# Patient Record
Sex: Female | Born: 2000 | Race: Black or African American | Hispanic: No | Marital: Single | Smoking: Never smoker
Health system: Southern US, Community
[De-identification: ages and names within clinical notes are randomized; demographics above are authoritative.]

## PROBLEM LIST (undated history)

## (undated) DIAGNOSIS — F419 Anxiety disorder, unspecified: Secondary | ICD-10-CM

## (undated) DIAGNOSIS — Z789 Other specified health status: Secondary | ICD-10-CM

## (undated) HISTORY — PX: NO PAST SURGERIES: SHX2092

---
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
## 2020-01-27 ENCOUNTER — Emergency Department (HOSPITAL_COMMUNITY)
Admission: EM | Admit: 2020-01-27 | Discharge: 2020-01-27 | Disposition: A | Discharge status: Home / Self Care | Payer: Medicaid Other | Attending: Emergency Medicine | Admitting: Emergency Medicine

## 2020-01-27 DIAGNOSIS — Z79899 Other long term (current) drug therapy: Secondary | ICD-10-CM | POA: Diagnosis not present

## 2020-01-27 DIAGNOSIS — R112 Nausea with vomiting, unspecified: Secondary | ICD-10-CM | POA: Insufficient documentation

## 2020-01-27 DIAGNOSIS — Z3490 Encounter for supervision of normal pregnancy, unspecified, unspecified trimester: Secondary | ICD-10-CM | POA: Insufficient documentation

## 2020-01-27 LAB — COMPREHENSIVE METABOLIC PANEL
ALT: 13 U/L (ref 0–44)
AST: 14 U/L — ABNORMAL LOW (ref 15–41)
Albumin: 4.8 g/dL (ref 3.5–5.0)
Alkaline Phosphatase: 65 U/L (ref 38–126)
Anion gap: 15 (ref 5–15)
BUN: 20 mg/dL (ref 6–20)
CO2: 19 mmol/L — ABNORMAL LOW (ref 22–32)
Calcium: 9.9 mg/dL (ref 8.9–10.3)
Chloride: 100 mmol/L (ref 98–111)
Creatinine, Ser: 0.77 mg/dL (ref 0.44–1.00)
GFR calc Af Amer: >60 mL/min (ref >60)
GFR calc non Af Amer: >60 mL/min (ref >60)
Glucose, Bld: 102 mg/dL — ABNORMAL HIGH (ref 70–99)
Potassium: 3.9 mmol/L (ref 3.5–5.1)
Sodium: 134 mmol/L — ABNORMAL LOW (ref 135–145)
Total Bilirubin: 1.4 mg/dL — ABNORMAL HIGH (ref 0.3–1.2)
Total Protein: 8.9 g/dL — ABNORMAL HIGH (ref 6.5–8.1)

## 2020-01-27 LAB — URINALYSIS, ROUTINE W REFLEX MICROSCOPIC
Bacteria, UA: NONE SEEN
Bilirubin Urine: NEGATIVE
Glucose, UA: NEGATIVE mg/dL
Hgb urine dipstick: NEGATIVE
Ketones, ur: 80 mg/dL — AB
Nitrite: NEGATIVE
Protein, ur: 100 mg/dL — AB
Specific Gravity, Urine: 1.03 (ref 1.005–1.030)
pH: 6 (ref 5.0–8.0)

## 2020-01-27 LAB — CBC
HCT: 41.4 % (ref 36.0–46.0)
Hemoglobin: 13.8 g/dL (ref 12.0–15.0)
MCH: 28.2 pg (ref 26.0–34.0)
MCHC: 33.3 g/dL (ref 30.0–36.0)
MCV: 84.7 fL (ref 80.0–100.0)
Platelets: 305 10*3/uL (ref 150–400)
RBC: 4.89 MIL/uL (ref 3.87–5.11)
RDW: 12.5 % (ref 11.5–15.5)
WBC: 13.5 10*3/uL — ABNORMAL HIGH (ref 4.0–10.5)
nRBC: 0 % (ref 0.0–0.2)

## 2020-01-27 LAB — HCG, QUANTITATIVE, PREGNANCY: hCG, Beta Chain, Quant, S: 79765 m[IU]/mL — ABNORMAL HIGH (ref <5)

## 2020-01-27 LAB — I-STAT BETA HCG BLOOD, ED (MC, WL, AP ONLY): I-stat hCG, quantitative: >2000 m[IU]/mL — ABNORMAL HIGH (ref <5)

## 2020-01-27 LAB — LIPASE, BLOOD: Lipase: 19 U/L (ref 11–51)

## 2020-01-27 MED ORDER — ONDANSETRON HCL 4 MG/2ML IJ SOLN
4.0000 mg | Freq: Once | INTRAMUSCULAR | Status: AC
  Administered 2020-01-27: 16:00:00 4 mg via INTRAVENOUS
  Filled 2020-01-27: qty 2

## 2020-01-27 MED ORDER — SODIUM CHLORIDE 0.9% FLUSH: 3.0000 mL | Freq: Once | INTRAVENOUS | Status: DC

## 2020-01-27 MED ORDER — LACTATED RINGERS IV BOLUS
1000.0000 mL | Freq: Once | INTRAVENOUS | Status: AC
  Administered 2020-01-27: 1000 mL via INTRAVENOUS

## 2020-01-27 NOTE — ED Provider Notes (Signed)
[facility]-EMERGENCY DEPT Provider Note   CSN: [id number] Arrival date & time: 01/27/20  1241     History Chief Complaint  Patient presents with  . Nausea  . Emesis    Linda Bates is a 19 y.o. female with no known past medical history presenting to emergency department today via EMS with chief complaint of nausea and emesis x3 days.  She estimates over 15 episodes of nonbloody nonbilious emesis in the last 24 hours.  She is having difficulty tolerating p.o. intake secondary to nausea.  She is also endorsing generalized weakness secondary to the continuous emesis. Her LMP was in December, however admits she has irregular periods.  She has not taken any medications for symptoms prior to arrival. She denies any fever, chills, chest pain, shortness of breath, lack of sense of taste or smell, abdominal pain, pelvic pain, vaginal discharge, abnormal vaginal bleeding, diarrhea.  No past medical history on file.  There are no problems to display for this patient.     OB History   No obstetric history on file.     No family history on file.  Social History   Tobacco Use  . Smoking status: Not on file  Substance Use Topics  . Alcohol use: Not on file  . Drug use: Not on file    Home Medications Prior to Admission medications   Medication Sig Start Date End Date Taking? Authorizing Provider  ibuprofen (ADVIL) 200 MG tablet Take 200 mg by mouth every 6 (six) hours as needed for moderate pain.   Yes [provider]  OVER THE COUNTER MEDICATION Take 1 tablet by mouth daily. Black cohash   Yes [provider]    Allergies    Patient has no known allergies.  Review of Systems   Review of Systems  All other systems are reviewed and are negative for acute change except as noted in the HPI.   Physical Exam Updated Vital Signs BP 122/75   Pulse 88   Temp 98.4 F (36.9 C) (Oral)   Resp 15   LMP 12/08/2019   SpO2 99%    Physical Exam Vitals and nursing note reviewed.  Constitutional:      General: She is not in acute distress.    Appearance: She is not ill-appearing.  HENT:     Head: Normocephalic and atraumatic.     Right Ear: Tympanic membrane and external ear normal.     Left Ear: Tympanic membrane and external ear normal.     Nose: Nose normal.     Mouth/Throat:     Mouth: Mucous membranes are dry.     Pharynx: Oropharynx is clear.  Eyes:     General: No scleral icterus.       Right eye: No discharge.        Left eye: No discharge.     Extraocular Movements: Extraocular movements intact.     Conjunctiva/sclera: Conjunctivae normal.     Pupils: Pupils are equal, round, and reactive to light.  Neck:     Vascular: No JVD.  Cardiovascular:     Rate and Rhythm: Normal rate and regular rhythm.     Pulses: Normal pulses.          Radial pulses are 2+ on the right side and 2+ on the left side.     Heart sounds: Normal heart sounds.  Pulmonary:     Comments: Lungs clear to auscultation in all fields. Symmetric chest rise. No wheezing, rales,  or rhonchi. Abdominal:     General: Bowel sounds are normal.     Tenderness: There is no right CVA tenderness or left CVA tenderness.     Comments: Abdomen is soft, non-distended, and non-tender in all quadrants. No rigidity, no guarding. No peritoneal signs.  Musculoskeletal:        General: Normal range of motion.     Cervical back: Normal range of motion.  Skin:    General: Skin is warm and dry.     Capillary Refill: Capillary refill takes less than 2 seconds.     Findings: No rash.  Neurological:     Mental Status: She is oriented to person, place, and time.     GCS: GCS eye subscore is 4. GCS verbal subscore is 5. GCS motor subscore is 6.     Comments: Fluent speech, no facial droop.  Psychiatric:        Behavior: Behavior normal.       ED Results / Procedures / Treatments   Labs (all labs ordered are listed, but only abnormal results are  displayed) Labs Reviewed  COMPREHENSIVE METABOLIC PANEL - Abnormal; Notable for the following components:      Result Value   Sodium 134 (*)    CO2 19 (*)    Glucose, Bld 102 (*)    Total Protein 8.9 (*)    AST 14 (*)    Total Bilirubin 1.4 (*)    All other components within normal limits  CBC - Abnormal; Notable for the following components:   WBC 13.5 (*)    All other components within normal limits  URINALYSIS, ROUTINE W REFLEX MICROSCOPIC - Abnormal; Notable for the following components:   Ketones, ur 80 (*)    Protein, ur 100 (*)    Leukocytes,Ua SMALL (*)    All other components within normal limits  HCG, QUANTITATIVE, PREGNANCY - Abnormal; Notable for the following components:   hCG, Beta Chain, Quant, S [id number] (*)    All other components within normal limits  I-STAT BETA HCG BLOOD, ED ([facility], [facility], [facility] ONLY) - Abnormal; Notable for the following components:   I-stat hCG, quantitative >2,000.0 (*)    All other components within normal limits  LIPASE, BLOOD    EKG None  Radiology No results found.  Procedures Procedures (including critical care time)  Medications Ordered in ED Medications  lactated ringers bolus 1,000 mL (0 mLs Intravenous Stopped 01/27/20 1702)  ondansetron (ZOFRAN) injection 4 mg (4 mg Intravenous Given 01/27/20 1555)  lactated ringers bolus 1,000 mL (0 mLs Intravenous Stopped 01/27/20 1819)    ED Course  I have reviewed the triage vital signs and the nursing notes.  Pertinent labs & imaging results that were available during my care of the patient were reviewed by me and considered in my medical decision making (see chart for details).    MDM Rules/Calculators/A&P                      Patient seen and examined. Patient presents awake, alert, hemodynamically stable, afebrile, non toxic. She was noted to be tachycardic to 106 in triage. During exam heart rate ranging from 84-97, no tachycardia. She looks dehydrated, has dry mucus membranes but  normal cap refill. On exam she has no abdominal tenderness.   I reviewed labs ordered in triage and it appears patient is pregnant, I-stat beta quant >2000.  CBC with leukocytosis of 13.5, no anemia.  I suspect her leukocytosis is related  to the assistant vomiting patient has had. Lipase is within normal range.  Her CMP is significant for bicarb of 19, total bili of 1.4, high end of normal anion gap, normal potassium. Will give 1L LR. Engaged in shared decision making for zofran given she is pregnant, discussed risks and benefits, patient would like zofran to help her nausea given how severe it has been.  Her vitals are stable, no tachcyardia and BP is normotensive. Given these reassuring findings and no abdominal pain on  exam feel that ectopic pregnancy is unlikely.  UA shows 80 ketones, small leukocytes, no WBC, nitrite negative. Second liter of LR given for dehydration. Quant obtained if needed for OB follow up, is 79,765.  On reassessment patient reports feeling much better. She is tolerating PO intake and has drank multiple cups of water while here in ED. She is eager to be discharged home. Patient given information for on call OB for follow up. She declines needs for prescription for prenatal vitamins, she plans to buy them OTC.  The patient appears reasonably screened and/or stabilized for discharge and I doubt any other medical condition or other [facility] requiring further screening, evaluation, or treatment in the ED at this time prior to discharge. The patient is safe for discharge with strict return precautions discussed. Recommend OB follow up for further workup of pregnancy. Findings and plan of care discussed with supervising physician Dr. Vanita Panda.   Portions of this note were generated with [employer]. Dictation errors may occur despite best attempts at proofreading.   Final Clinical Impression(s) / ED Diagnoses Final diagnoses:  Pregnancy, unspecified gestational age   Non-intractable vomiting with nausea, unspecified vomiting type    Rx / DC Orders ED Discharge Orders    None       Flint Melter 01/28/20 0020    Carmin Muskrat, MD 01/28/20 1948

## 2020-01-27 NOTE — Discharge Instructions (Addendum)
You have been seen today for nausea and vomiting. Please read and follow all provided instructions. Return to the emergency room for worsening condition or new concerning symptoms including abdominal pain, abdominal cramping, vaginal bleeding, worsening nausea and vomiting.  Your beta quant today was 79,765.  That does not come exactly how far along you are but I guess you could be around 5 weeks however I am not positive.  You will need to see the OB doctors to confirm how far along you are.  1. Medications:  Recommend you start taking prenatal vitamins as we discussed.  He can buy these over-the-counter. Do not take ibuprofen, Aleve, Motrin, Advil as these are all anti-inflammatory medications are not recommended in pregnancy. Take medications as prescribed. Please review all of the medicines and only take them if you do not have an allergy to them.   2. Treatment: rest, drink plenty of fluids  3. Follow Up:  Please follow up with an OB doctor. I have given you the information for the women's clinic. Please call their office to schedule a follow up appointment and to establish prenatal care.   It is also a possibility that you have an allergic reaction to any of the medicines that you have been prescribed - Everybody reacts differently to medications and while MOST people have no trouble with most medicines, you may have a reaction such as nausea, vomiting, rash, swelling, shortness of breath. If this is the case, please stop taking the medicine immediately and contact your physician.  ?

## 2020-01-27 NOTE — ED Triage Notes (Signed)
N/V x 3 days, BIB EMS

## 2020-01-30 ENCOUNTER — Emergency Department (HOSPITAL_COMMUNITY)
Admission: EM | Admit: 2020-01-30 | Discharge: 2020-01-30 | Disposition: A | Discharge status: Home / Self Care | Payer: Medicaid Other | Attending: Emergency Medicine | Admitting: Emergency Medicine

## 2020-01-30 ENCOUNTER — Other Ambulatory Visit: Payer: Self-pay

## 2020-01-30 ENCOUNTER — Encounter (HOSPITAL_COMMUNITY): Payer: Self-pay

## 2020-01-30 DIAGNOSIS — O219 Vomiting of pregnancy, unspecified: Secondary | ICD-10-CM | POA: Diagnosis present

## 2020-01-30 DIAGNOSIS — Z3A00 Weeks of gestation of pregnancy not specified: Secondary | ICD-10-CM | POA: Insufficient documentation

## 2020-01-30 DIAGNOSIS — O210 Mild hyperemesis gravidarum: Secondary | ICD-10-CM | POA: Diagnosis not present

## 2020-01-30 LAB — COMPREHENSIVE METABOLIC PANEL WITH GFR
ALT: 28 U/L (ref 0–44)
AST: 21 U/L (ref 15–41)
Albumin: 4.8 g/dL (ref 3.5–5.0)
Alkaline Phosphatase: 68 U/L (ref 38–126)
Anion gap: 16 — ABNORMAL HIGH (ref 5–15)
BUN: 17 mg/dL (ref 6–20)
CO2: 21 mmol/L — ABNORMAL LOW (ref 22–32)
Calcium: 9.9 mg/dL (ref 8.9–10.3)
Chloride: 98 mmol/L (ref 98–111)
Creatinine, Ser: 0.71 mg/dL (ref 0.44–1.00)
GFR calc Af Amer: >60 mL/min
GFR calc non Af Amer: >60 mL/min
Glucose, Bld: 91 mg/dL (ref 70–99)
Potassium: 3.1 mmol/L — ABNORMAL LOW (ref 3.5–5.1)
Sodium: 135 mmol/L (ref 135–145)
Total Bilirubin: 1.2 mg/dL (ref 0.3–1.2)
Total Protein: 9 g/dL — ABNORMAL HIGH (ref 6.5–8.1)

## 2020-01-30 LAB — LIPASE, BLOOD: Lipase: 22 U/L (ref 11–51)

## 2020-01-30 LAB — CBC
HCT: 39.6 % (ref 36.0–46.0)
Hemoglobin: 13.7 g/dL (ref 12.0–15.0)
MCH: 28.4 pg (ref 26.0–34.0)
MCHC: 34.6 g/dL (ref 30.0–36.0)
MCV: 82.2 fL (ref 80.0–100.0)
Platelets: 327 K/uL (ref 150–400)
RBC: 4.82 MIL/uL (ref 3.87–5.11)
RDW: 12.4 % (ref 11.5–15.5)
WBC: 9.2 K/uL (ref 4.0–10.5)
nRBC: 0 % (ref 0.0–0.2)

## 2020-01-30 LAB — I-STAT BETA HCG BLOOD, ED (MC, WL, AP ONLY): I-stat hCG, quantitative: >2000 m[IU]/mL — ABNORMAL HIGH (ref <5)

## 2020-01-30 LAB — HCG, QUANTITATIVE, PREGNANCY: hCG, Beta Chain, Quant, S: 122899 m[IU]/mL — ABNORMAL HIGH (ref <5)

## 2020-01-30 MED ORDER — ONDANSETRON 4 MG PO TBDP: 4.0000 mg | ORAL_TABLET | Freq: Three times a day (TID) | ORAL | 0 refills | Status: DC | PRN

## 2020-01-30 MED ORDER — METOCLOPRAMIDE HCL 10 MG PO TABS
10.0000 mg | ORAL_TABLET | Freq: Once | ORAL | Status: AC
  Administered 2020-01-30: 10 mg via ORAL
  Filled 2020-01-30: qty 1

## 2020-01-30 MED ORDER — SODIUM CHLORIDE 0.9 % IV BOLUS
1000.0000 mL | Freq: Once | INTRAVENOUS | Status: AC
  Administered 2020-01-30: 1000 mL via INTRAVENOUS

## 2020-01-30 MED ORDER — SODIUM CHLORIDE 0.9% FLUSH: 3.0000 mL | Freq: Once | INTRAVENOUS | Status: DC

## 2020-01-30 MED ORDER — ONDANSETRON HCL 4 MG/2ML IJ SOLN
4.0000 mg | Freq: Once | INTRAMUSCULAR | Status: AC
  Administered 2020-01-30: 17:00:00 4 mg via INTRAVENOUS
  Filled 2020-01-30: qty 2

## 2020-01-30 NOTE — Discharge Instructions (Signed)
Make sure you are staying well-hydrated water. Use Zofran as needed for nausea or vomiting.  This is a pill that will dissolve under your tongue, you so you do not need to swallow if you are feeling nauseous. Follow-up with the women's clinic after scheduled appointment next week. Return to the emergency room if you develop fevers, persistent vomiting, severe worsening abdominal pain, vaginal bleeding, any new, worsening, or concerning symptoms.

## 2020-01-30 NOTE — ED Provider Notes (Signed)
[facility]-EMERGENCY DEPT Provider Note   CSN: [id number] Arrival date & time: 01/30/20  1437     History Chief Complaint  Patient presents with  . Emesis During Pregnancy  . Abdominal Pain    Linda Bates is a 19 y.o. female presenting for evaluation of nausea and vomiting.   Patient states for the past week, she is having persistent nausea and vomiting.  She states she is pregnant, does not know how far along she is.  Her last period was in December, 2 months ago.  She states she was seen in the ED, felt better after fluids and medicine, but did not get any medicine for home.  She has not followed up with OB/GYN, has an appointment next week.  She denies fevers, chills, chest pain, cough, abdominal pain, urinary symptoms, vaginal bleeding, or abnormal bowel movements.  She has no other medical problems, takes medications daily.  She has not taken anything for her symptoms.  Patient states anytime she tries to eat or drink, this prompts vomiting.  HPI     History reviewed. No pertinent past medical history.  Patient Active Problem List   Diagnosis Date Noted  . Tension headache 11/20/2016  . Migraine without aura and without status migrainosus, not intractable 11/20/2016  . Anxiety state 11/20/2016    History reviewed. No pertinent surgical history.   OB History    Gravida  1   Para      Term      Preterm      AB      Living        SAB      TAB      Ectopic      Multiple      Live Births              Family History  Problem Relation Age of Onset  . Migraines Mother   . Heart attack Paternal Grandfather     Social History   Tobacco Use  . Smoking status: Never Smoker  . Smokeless tobacco: Never Used  Substance Use Topics  . Alcohol use: No  . Drug use: No    Home Medications Prior to Admission medications   Medication Sig Start Date End Date Taking? Authorizing Provider  amitriptyline (ELAVIL) 25 MG tablet  Take 1 tablet (25 mg total) by mouth at bedtime. Patient not taking: Reported on 08/24/2017 11/20/16   Keturah Shavers, MD  ibuprofen (ADVIL) 200 MG tablet Take 200 mg by mouth every 6 (six) hours as needed for moderate pain.    [provider]  ondansetron (ZOFRAN ODT) 4 MG disintegrating tablet Take 1 tablet (4 mg total) by mouth every 8 (eight) hours as needed for nausea or vomiting. 01/30/20   Remigio Mcmillon, PA-C  OVER THE COUNTER MEDICATION Take 1 tablet by mouth daily. Black cohash    [provider]    Allergies    Patient has no known allergies.  Review of Systems   Review of Systems  Gastrointestinal: Positive for nausea and vomiting.    Physical Exam Updated Vital Signs BP 116/60 (BP Location: Left Arm)   Pulse 68   Temp 98.2 F (36.8 C) (Oral)   Resp 14   LMP 12/08/2019   SpO2 99%   Physical Exam Vitals and nursing note reviewed.  Constitutional:      General: She is not in acute distress.    Appearance: She is well-developed.     Comments: Resting  in the bed in no acute distress  HENT:     Head: Normocephalic and atraumatic.  Eyes:     Conjunctiva/sclera: Conjunctivae normal.     Pupils: Pupils are equal, round, and reactive to light.  Cardiovascular:     Rate and Rhythm: Normal rate and regular rhythm.     Pulses: Normal pulses.  Pulmonary:     Effort: Pulmonary effort is normal. No respiratory distress.     Breath sounds: Normal breath sounds. No wheezing.  Abdominal:     General: There is no distension.     Palpations: Abdomen is soft. There is no mass.     Tenderness: There is no abdominal tenderness. There is no guarding or rebound.     Comments: No tenderness palpation the abdomen.  Soft without rigidity, guarding, distention.  Negative rebound.  Musculoskeletal:        General: Normal range of motion.     Cervical back: Normal range of motion and neck supple.  Skin:    General: Skin is warm and dry.     Capillary Refill:  Capillary refill takes less than 2 seconds.  Neurological:     Mental Status: She is alert and oriented to person, place, and time.     ED Results / Procedures / Treatments   Labs (all labs ordered are listed, but only abnormal results are displayed) Labs Reviewed  COMPREHENSIVE METABOLIC PANEL - Abnormal; Notable for the following components:      Result Value   Potassium 3.1 (*)    CO2 21 (*)    Total Protein 9.0 (*)    Anion gap 16 (*)    All other components within normal limits  HCG, QUANTITATIVE, PREGNANCY - Abnormal; Notable for the following components:   hCG, Beta Chain, Quant, S 122,899 (*)    All other components within normal limits  I-STAT BETA HCG BLOOD, ED ([facility], [facility], [facility] ONLY) - Abnormal; Notable for the following components:   I-stat hCG, quantitative >2,000.0 (*)    All other components within normal limits  LIPASE, BLOOD  CBC  URINALYSIS, ROUTINE W REFLEX MICROSCOPIC    EKG None  Radiology No results found.  Procedures Procedures (including critical care time)  Medications Ordered in ED Medications  sodium chloride flush (NS) 0.9 % injection 3 mL (0 mLs Intravenous Hold 01/30/20 1538)  sodium chloride 0.9 % bolus 1,000 mL (0 mLs Intravenous Stopped 01/30/20 1939)  ondansetron (ZOFRAN) injection 4 mg (4 mg Intravenous Given 01/30/20 1700)  metoCLOPramide (REGLAN) tablet 10 mg (10 mg Oral Given 01/30/20 1857)    ED Course  I have reviewed the triage vital signs and the nursing notes.  Pertinent labs & imaging results that were available during my care of the patient were reviewed by me and considered in my medical decision making (see chart for details).    MDM Rules/Calculators/A&P                      Patient presenting for evaluation of nausea and vomiting in the setting of pregnancy.  Physical exam shows patient appears nontoxic.  Abdominal exam is reassuring, no tenderness.  Likely hyperemesis gravidarum.  Will obtain labs to assess electrolyte  status and creatinine.  Will treat with fluids and Zofran and reassess.  Labs interpreted by me, overall reassuring.  Mild hypokalemia, likely 2/2 vomting and will improve with regular PO intake. .  Creatinine stable.  Gap of 16, likely due to vomiting.  HCG 122,000,  likely first trimester but unknown number of weeks.  On reassessment, patient reports she is feeling better.  P.o. challenge with apple juice.  On reassessment, patient reports no further vomiting.  She is still slightly nauseous.  Will give Reglan p.o., but as patient is no longer vomiting, can go home.  Discussed importance of follow-up with OB/GYN as scheduled.  Discussed prompt return with worsening symptoms.  At this time, patient appears safe for discharge.  Return precautions given.  Patient states she understands and agrees to plan.  Final Clinical Impression(s) / ED Diagnoses Final diagnoses:  Hyperemesis gravidarum    Rx / DC Orders ED Discharge Orders         Ordered    ondansetron (ZOFRAN ODT) 4 MG disintegrating tablet  Every 8 hours PRN     01/30/20 1922           Franchot Heidelberg, PA-C 01/30/20 2007    Dorie Rank, MD 02/02/20 1040

## 2020-01-30 NOTE — ED Triage Notes (Signed)
Pt presents with c/o vomiting during pregnancy. Pt reports her last period was sometime in December and she does have a confirmed pregnancy, unsure of how far along she is.

## 2020-01-30 NOTE — ED Notes (Signed)
Rayfield Citizen, RN and [place], RN attempted IV insertion. Kathlene November, RN will attempt IV ultrasound.

## 2020-02-01 ENCOUNTER — Inpatient Hospital Stay (HOSPITAL_COMMUNITY)
Admission: EM | Admit: 2020-02-01 | Discharge: 2020-02-01 | Disposition: A | Discharge status: Home / Self Care | Payer: Medicaid Other | Attending: Obstetrics & Gynecology | Admitting: Obstetrics & Gynecology

## 2020-02-01 ENCOUNTER — Other Ambulatory Visit: Payer: Self-pay

## 2020-02-01 ENCOUNTER — Encounter (HOSPITAL_COMMUNITY): Payer: Self-pay | Admitting: Emergency Medicine

## 2020-02-01 ENCOUNTER — Inpatient Hospital Stay (HOSPITAL_COMMUNITY): Payer: Medicaid Other

## 2020-02-01 DIAGNOSIS — Z3A01 Less than 8 weeks gestation of pregnancy: Secondary | ICD-10-CM | POA: Diagnosis not present

## 2020-02-01 DIAGNOSIS — O219 Vomiting of pregnancy, unspecified: Secondary | ICD-10-CM | POA: Diagnosis present

## 2020-02-01 DIAGNOSIS — O26899 Other specified pregnancy related conditions, unspecified trimester: Secondary | ICD-10-CM

## 2020-02-01 DIAGNOSIS — R109 Unspecified abdominal pain: Secondary | ICD-10-CM

## 2020-02-01 HISTORY — DX: Other specified health status: Z78.9

## 2020-02-01 LAB — URINALYSIS, ROUTINE W REFLEX MICROSCOPIC
Bilirubin Urine: NEGATIVE
Glucose, UA: NEGATIVE mg/dL
Hgb urine dipstick: NEGATIVE
Ketones, ur: 80 mg/dL — AB
Nitrite: NEGATIVE
Protein, ur: 30 mg/dL — AB
Specific Gravity, Urine: 1.027 (ref 1.005–1.030)
pH: 6 (ref 5.0–8.0)

## 2020-02-01 LAB — COMPREHENSIVE METABOLIC PANEL
ALT: 39 U/L (ref 0–44)
AST: 39 U/L (ref 15–41)
Albumin: 4.1 g/dL (ref 3.5–5.0)
Alkaline Phosphatase: 64 U/L (ref 38–126)
Anion gap: 17 — ABNORMAL HIGH (ref 5–15)
BUN: 14 mg/dL (ref 6–20)
CO2: 20 mmol/L — ABNORMAL LOW (ref 22–32)
Calcium: 9.6 mg/dL (ref 8.9–10.3)
Chloride: 98 mmol/L (ref 98–111)
Creatinine, Ser: 0.69 mg/dL (ref 0.44–1.00)
GFR calc Af Amer: >60 mL/min (ref >60)
GFR calc non Af Amer: >60 mL/min (ref >60)
Glucose, Bld: 84 mg/dL (ref 70–99)
Potassium: 3.5 mmol/L (ref 3.5–5.1)
Sodium: 135 mmol/L (ref 135–145)
Total Bilirubin: 2.2 mg/dL — ABNORMAL HIGH (ref 0.3–1.2)
Total Protein: 7.3 g/dL (ref 6.5–8.1)

## 2020-02-01 LAB — CBC
HCT: 38.4 % (ref 36.0–46.0)
Hemoglobin: 13.2 g/dL (ref 12.0–15.0)
MCH: 28.3 pg (ref 26.0–34.0)
MCHC: 34.4 g/dL (ref 30.0–36.0)
MCV: 82.2 fL (ref 80.0–100.0)
Platelets: 344 10*3/uL (ref 150–400)
RBC: 4.67 MIL/uL (ref 3.87–5.11)
RDW: 12.5 % (ref 11.5–15.5)
WBC: 9.8 10*3/uL (ref 4.0–10.5)
nRBC: 0 % (ref 0.0–0.2)

## 2020-02-01 LAB — WET PREP, GENITAL
Clue Cells Wet Prep HPF POC: NONE SEEN
Sperm: NONE SEEN
Trich, Wet Prep: NONE SEEN

## 2020-02-01 MED ORDER — SCOPOLAMINE 1 MG/3DAYS TD PT72
1.0000 | MEDICATED_PATCH | TRANSDERMAL | Status: DC
  Administered 2020-02-01: 20:00:00 1.5 mg via TRANSDERMAL
  Filled 2020-02-01: qty 1

## 2020-02-01 MED ORDER — PROMETHAZINE HCL 25 MG PO TABS: 25.0000 mg | ORAL_TABLET | Freq: Four times a day (QID) | ORAL | 0 refills | Status: DC | PRN

## 2020-02-01 MED ORDER — PROMETHAZINE HCL 25 MG/ML IJ SOLN
25.0000 mg | Freq: Four times a day (QID) | INTRAMUSCULAR | Status: DC | PRN
  Administered 2020-02-01: 21:00:00 25 mg via INTRAVENOUS
  Filled 2020-02-01: qty 1

## 2020-02-01 MED ORDER — FAMOTIDINE IN NACL 20-0.9 MG/50ML-% IV SOLN
20.0000 mg | Freq: Once | INTRAVENOUS | Status: AC
  Administered 2020-02-01: 21:00:00 20 mg via INTRAVENOUS
  Filled 2020-02-01: qty 50

## 2020-02-01 MED ORDER — LACTATED RINGERS IV BOLUS
1000.0000 mL | Freq: Once | INTRAVENOUS | Status: AC
  Administered 2020-02-01: 21:00:00 1000 mL via INTRAVENOUS

## 2020-02-01 MED ORDER — SCOPOLAMINE 1 MG/3DAYS TD PT72: 1.0000 | MEDICATED_PATCH | TRANSDERMAL | 12 refills | Status: DC

## 2020-02-01 NOTE — MAU Note (Addendum)
Took Home UPT early February and it was positive. Vomited 6-7 times today.  Taking Zofran, tried twice today and didn't help. Lower abd pain, "aching" x 1 week. No vag bleeding or discharge.

## 2020-02-01 NOTE — Discharge Instructions (Signed)
Morning Sickness ° °Morning sickness is when you feel sick to your stomach (nauseous) during pregnancy. You may feel sick to your stomach and throw up (vomit). You may feel sick in the morning, but you can feel this way at any time of day. Some women feel very sick to their stomach and cannot stop throwing up (hyperemesis gravidarum). °Follow these instructions at home: °Medicines °· Take over-the-counter and prescription medicines only as told by your doctor. Do not take any medicines until you talk with your doctor about them first. °· Taking multivitamins before getting pregnant can stop or lessen the harshness of morning sickness. °Eating and drinking °· Eat dry toast or crackers before getting out of bed. °· Eat 5 or 6 small meals a day. °· Eat dry and bland foods like rice and baked potatoes. °· Do not eat greasy, fatty, or spicy foods. °· Have someone cook for you if the smell of food causes you to feel sick or throw up. °· If you feel sick to your stomach after taking prenatal vitamins, take them at night or with a snack. °· Eat protein when you need a snack. Nuts, yogurt, and cheese are good choices. °· Drink fluids throughout the day. °· Try ginger ale made with real ginger, ginger tea made from fresh grated ginger, or ginger candies. °General instructions °· Do not use any products that have nicotine or tobacco in them, such as cigarettes and e-cigarettes. If you need help quitting, ask your doctor. °· Use an air purifier to keep the air in your house free of smells. °· Get lots of fresh air. °· Try to avoid smells that make you feel sick. °· Try: °? Wearing a bracelet that is used for seasickness (acupressure wristband). °? Going to a doctor who puts thin needles into certain body points (acupuncture) to improve how you feel. °Contact a doctor if: °· You need medicine to feel better. °· You feel dizzy or light-headed. °· You are losing weight. °Get help right away if: °· You feel very sick to your  stomach and cannot stop throwing up. °· You pass out (faint). °· You have very bad pain in your belly. °Summary °· Morning sickness is when you feel sick to your stomach (nauseous) during pregnancy. °· You may feel sick in the morning, but you can feel this way at any time of day. °· Making some changes to what you eat may help your symptoms go away. °This information is not intended to replace advice given to you by your health care provider. Make sure you discuss any questions you have with your health care provider. °Document Revised: 11/15/2017 Document Reviewed: 01/03/2017 °Elsevier Patient Education © 2020 [employer] ° °

## 2020-02-01 NOTE — ED Triage Notes (Signed)
Pt endorses abd pain x1 week with nausea. States its her 3rd visit to the ER in a week and has not gotten any better. Pt pregnant but unsure how far along.

## 2020-02-01 NOTE — MAU Provider Note (Addendum)
History     CSN: [id number]  Arrival date and time: 02/01/20 1830   First Provider Initiated Contact with Patient 02/01/20 1924      Chief Complaint  Patient presents with  . Abdominal Pain   19 y.o. G1 @[redacted]w[redacted]d  presenting with N/V and LAP. Sx have been ongoing for 1 week. This is her 3rd ED visit for the N/V. She was given Zofran and has been taking it without relief. Reports unable to tolerate anything po. Denies fever. LAP is bilateral. Denies urinary sx. No VB. Having clear vaginal discharge. No itching. She is planning termination in 2 days.   OB History    Gravida  1   Para      Term      Preterm      AB      Living        SAB      TAB      Ectopic      Multiple      Live Births              Past Medical History:  Diagnosis Date  . Medical history non-contributory     Past Surgical History:  Procedure Laterality Date  . NO PAST SURGERIES      Family History  Problem Relation Age of Onset  . Migraines Mother   . Heart attack Paternal Grandfather     Social History   Tobacco Use  . Smoking status: Never Smoker  . Smokeless tobacco: Never Used  Substance Use Topics  . Alcohol use: No  . Drug use: No    Allergies: No Known Allergies  Medications Prior to Admission  Medication Sig Dispense Refill Last Dose  . ondansetron (ZOFRAN ODT) 4 MG disintegrating tablet Take 1 tablet (4 mg total) by mouth every 8 (eight) hours as needed for nausea or vomiting. 15 tablet 0 02/01/2020 at Unknown time  . amitriptyline (ELAVIL) 25 MG tablet Take 1 tablet (25 mg total) by mouth at bedtime. (Patient not taking: Reported on 08/24/2017) 30 tablet 3   . ibuprofen (ADVIL) 200 MG tablet Take 200 mg by mouth every 6 (six) hours as needed for moderate pain.     10/24/2017 OVER THE COUNTER MEDICATION Take 1 tablet by mouth daily. Black cohash       Review of Systems  Constitutional: Negative for chills and fever.  Gastrointestinal: Positive for abdominal pain, nausea and  vomiting.  Genitourinary: Negative for dysuria, hematuria, vaginal bleeding and vaginal discharge.   Physical Exam   Blood pressure 130/68, pulse (!) 56, temperature 98.4 F (36.9 C), temperature source Oral, resp. rate 16, height 5\' 3"  (1.6 m), weight 59 kg, last menstrual period 12/08/2019, SpO2 100 %.  Physical Exam  Nursing note and vitals reviewed. Constitutional: She is oriented to person, place, and time. She appears well-developed and well-nourished. No distress.  HENT:  Head: Normocephalic and atraumatic.  Cardiovascular: Normal rate.  Respiratory: Effort normal. No respiratory distress.  GI: Soft. She exhibits no distension and no mass. There is no abdominal tenderness. There is no rebound and no guarding.  Genitourinary: Uterus is enlarged. Uterus is not tender. Cervix exhibits no motion tenderness. Right adnexum displays no mass and no tenderness. Left adnexum displays no mass and no tenderness.  Musculoskeletal:     Cervical back: Normal range of motion.  Neurological: She is alert and oriented to person, place, and time.  Skin: Skin is warm and dry.  Psychiatric: She has a  normal mood and affect.   Results for orders placed or performed during the hospital encounter of 02/01/20 (from the past 24 hour(s))  Urinalysis, Routine w reflex microscopic     Status: Abnormal   Collection Time: 02/01/20  7:04 PM  Result Value Ref Range   Color, Urine AMBER (A) YELLOW   APPearance CLOUDY (A) CLEAR   Specific Gravity, Urine 1.027 1.005 - 1.030   pH 6.0 5.0 - 8.0   Glucose, UA NEGATIVE NEGATIVE mg/dL   Hgb urine dipstick NEGATIVE NEGATIVE   Bilirubin Urine NEGATIVE NEGATIVE   Ketones, ur 80 (A) NEGATIVE mg/dL   Protein, ur 30 (A) NEGATIVE mg/dL   Nitrite NEGATIVE NEGATIVE   Leukocytes,Ua LARGE (A) NEGATIVE   RBC / HPF 0-5 0 - 5 RBC/hpf   WBC, UA 11-20 0 - 5 WBC/hpf   Bacteria, UA MANY (A) NONE SEEN   Squamous Epithelial / LPF 11-20 0 - 5   Mucus PRESENT    Hyaline Casts,  UA PRESENT    MAU Course  Procedures Meds ordered this encounter  Medications  . scopolamine (TRANSDERM-SCOP) 1 MG/3DAYS 1.5 mg  . lactated ringers bolus 1,000 mL  . promethazine (PHENERGAN) injection 25 mg  . famotidine (PEPCID) IVPB 20 mg premix   MDM Labs and [place] ordered. Transfer of care given to Laverle Hobby, [place]  02/01/2020 7:58 PM   Patient Vitals for the past 24 hrs:  BP Temp Temp src Pulse Resp SpO2 Height Weight  02/01/20 1917 130/68 98.4 F (36.9 C) Oral (!) 56 16 100 % -- --  02/01/20 1844 105/75 (!) 97.5 F (36.4 C) Oral 81 16 100 % -- --  02/01/20 1842 -- -- -- -- -- -- 5\' 3"  (1.6 m) 59 kg   Results for orders placed or performed during the hospital encounter of 02/01/20 (from the past 24 hour(s))  Urinalysis, Routine w reflex microscopic     Status: Abnormal   Collection Time: 02/01/20  7:04 PM  Result Value Ref Range   Color, Urine AMBER (A) YELLOW   APPearance CLOUDY (A) CLEAR   Specific Gravity, Urine 1.027 1.005 - 1.030   pH 6.0 5.0 - 8.0   Glucose, UA NEGATIVE NEGATIVE mg/dL   Hgb urine dipstick NEGATIVE NEGATIVE   Bilirubin Urine NEGATIVE NEGATIVE   Ketones, ur 80 (A) NEGATIVE mg/dL   Protein, ur 30 (A) NEGATIVE mg/dL   Nitrite NEGATIVE NEGATIVE   Leukocytes,Ua LARGE (A) NEGATIVE   RBC / HPF 0-5 0 - 5 RBC/hpf   WBC, UA 11-20 0 - 5 WBC/hpf   Bacteria, UA MANY (A) NONE SEEN   Squamous Epithelial / LPF 11-20 0 - 5   Mucus PRESENT    Hyaline Casts, UA PRESENT   CBC     Status: None   Collection Time: 02/01/20  7:44 PM  Result Value Ref Range   WBC 9.8 4.0 - 10.5 K/uL   RBC 4.67 3.87 - 5.11 MIL/uL   Hemoglobin 13.2 12.0 - 15.0 g/dL   HCT 38.4 36.0 - 46.0 %   MCV 82.2 80.0 - 100.0 fL   MCH 28.3 26.0 - 34.0 pg   MCHC 34.4 30.0 - 36.0 g/dL   RDW 12.5 11.5 - 15.5 %   Platelets 344 150 - 400 K/uL   nRBC 0.0 0.0 - 0.2 %  Comprehensive metabolic panel     Status: Abnormal   Collection Time: 02/01/20  7:44 PM  Result Value Ref  Range  Sodium 135 135 - 145 mmol/L   Potassium 3.5 3.5 - 5.1 mmol/L   Chloride 98 98 - 111 mmol/L   CO2 20 (L) 22 - 32 mmol/L   Glucose, Bld 84 70 - 99 mg/dL   BUN 14 6 - 20 mg/dL   Creatinine, Ser 9.93 0.44 - 1.00 mg/dL   Calcium 9.6 8.9 - 71.6 mg/dL   Total Protein 7.3 6.5 - 8.1 g/dL   Albumin 4.1 3.5 - 5.0 g/dL   AST 39 15 - 41 U/L   ALT 39 0 - 44 U/L   Alkaline Phosphatase 64 38 - 126 U/L   Total Bilirubin 2.2 (H) 0.3 - 1.2 mg/dL   GFR calc non Af Amer >60 >60 mL/min   GFR calc Af Amer >60 >60 mL/min   Anion gap 17 (H) 5 - 15  Wet prep, genital     Status: Abnormal   Collection Time: 02/01/20  7:44 PM   Specimen: Vaginal  Result Value Ref Range   Yeast Wet Prep HPF POC PRESENT (A) NONE SEEN   Trich, Wet Prep NONE SEEN NONE SEEN   Clue Cells Wet Prep HPF POC NONE SEEN NONE SEEN   WBC, Wet Prep HPF POC MANY (A) NONE SEEN   Sperm NONE SEEN    US OB LESS THAN 14 WEEKS WITH OB TRANSVAGINAL  Result Date: 02/01/2020 CLINICAL DATA:  Pelvic pain EXAM: OBSTETRIC <14 WK [place] AND TRANSVAGINAL OB US TECHNIQUE: Both transabdominal and transvaginal ultrasound examinations were performed for complete evaluation of the gestation as well as the maternal uterus, adnexal regions, and pelvic cul-de-sac. Transvaginal technique was performed to assess early pregnancy. COMPARISON:  None. FINDINGS: Intrauterine gestational sac: Single Yolk sac:  Visualized. Embryo:  Visualized. Cardiac Activity: Visualized. Heart Rate: 129 bpm CRL: 6.7 mm   6 w   3 d                  [place] EDC: 09/23/2020 Subchorionic hemorrhage: There is a trace amount of subchorionic hemorrhage. Maternal uterus/adnexae: There is no maternal abnormality detected. There is no significant free fluid. IMPRESSION: Single live IUP at 6 weeks and 3 days as detailed above. There is a trace amount of subchorionic hemorrhage. Electronically Signed   By: Katherine Mantle M.D.   On: 02/01/2020 21:15   Meds ordered this encounter  Medications  .  scopolamine (TRANSDERM-SCOP) 1 MG/3DAYS 1.5 mg  . lactated ringers bolus 1,000 mL  . promethazine (PHENERGAN) injection 25 mg  . famotidine (PEPCID) IVPB 20 mg premix  . scopolamine (TRANSDERM-SCOP) 1 MG/3DAYS    Sig: Place 1 patch (1.5 mg total) onto the skin every 3 (three) days.    Dispense:  10 patch    Refill:  12    Order Specific Question:   Supervising Provider    Answer:   Adam Phenix [[id number]]  . promethazine (PHENERGAN) 25 MG tablet    Sig: Take 1 tablet (25 mg total) by mouth every 6 (six) hours as needed for nausea or vomiting.    Dispense:  30 tablet    Refill:  0    Order Specific Question:   Supervising Provider    Answer:   Adam Phenix [[id number]]   Assessment and Plan  --19 y.o.  G1P0 with SIUP at [redacted]w[redacted]d  --Tolerating PO prior to discharge --Discharge home in stable condition  F/U: Patient has appointment to terminate pregnancy on Wednesday but is aware she can return to MAU for evaluation of pregnancy-related  complaints.  Clayton Bibles, MSN, CNM Certified Nurse Midwife, [facility] for [employer], [facility] 02/01/20 10:56 PM

## 2020-02-03 ENCOUNTER — Telehealth (HOSPITAL_COMMUNITY): Payer: Self-pay

## 2020-02-03 LAB — GC/CHLAMYDIA PROBE AMP (~~LOC~~) NOT AT ARMC
Chlamydia: POSITIVE — AB
Comment: NEGATIVE
Comment: NORMAL
Neisseria Gonorrhea: NEGATIVE

## 2020-02-04 ENCOUNTER — Telehealth: Payer: Self-pay | Admitting: Advanced Practice Midwife

## 2020-02-04 LAB — CULTURE, OB URINE: Culture: >=100000 — AB

## 2020-02-04 NOTE — Telephone Encounter (Signed)
Patient's grandmother answered her phone. Identified myself and asked for call back, given MAU Provider workroom number.   Clayton Bibles, MSN, CNM Certified Nurse Midwife, [employer] for [employer], [facility] 02/04/20 2:40 PM

## 2020-02-05 ENCOUNTER — Telehealth: Payer: Self-pay | Admitting: Certified Nurse Midwife

## 2020-02-05 NOTE — Telephone Encounter (Signed)
+  UTI and Chlamydia. No answer, left message to call back at [phone].

## 2020-02-06 ENCOUNTER — Telehealth: Payer: Self-pay | Admitting: Obstetrics and Gynecology

## 2020-02-06 NOTE — Telephone Encounter (Signed)
Attempted to call patient again regarding results. Message left to return call to [phone].  Victorino Dike, NP

## 2020-02-25 ENCOUNTER — Telehealth: Payer: Self-pay | Admitting: Women's Health

## 2020-02-25 DIAGNOSIS — A749 Chlamydial infection, unspecified: Secondary | ICD-10-CM

## 2020-02-25 MED ORDER — AZITHROMYCIN 500 MG PO TABS: 1000.0000 mg | ORAL_TABLET | Freq: Once | ORAL | 0 refills | Status: AC

## 2020-02-25 NOTE — Telephone Encounter (Signed)
Pt called MAU stating that she was there several weeks ago and was called with test results but did not get the voicemail until today. Looked up patient's chart information and saw there were attempts to notify her about positive chlamydia. Pt reports she has not been notified about this result or treated.  Pt reports NKDA. Pt denies taking any medications at this time. Pt reports she is no longer pregnant.  Pt advised that all partners in the past 60 days should be tested and treated. Pt advised to refrain from intercourse from now until 7 days after her current partner is treated. Pt verbalizes understanding and questions answered to patient satisfaction.  RX azithromycin 1000mg  sent.  , NP  5:50 PM 02/25/2020

## 2020-07-19 ENCOUNTER — Other Ambulatory Visit: Payer: Self-pay

## 2020-07-19 ENCOUNTER — Emergency Department (HOSPITAL_COMMUNITY)
Admission: EM | Admit: 2020-07-19 | Discharge: 2020-07-19 | Disposition: A | Discharge status: Home / Self Care | Payer: Medicaid Other | Attending: Emergency Medicine | Admitting: Emergency Medicine

## 2020-07-19 ENCOUNTER — Encounter (HOSPITAL_COMMUNITY): Payer: Self-pay | Admitting: Emergency Medicine

## 2020-07-19 DIAGNOSIS — Z5321 Procedure and treatment not carried out due to patient leaving prior to being seen by health care provider: Secondary | ICD-10-CM | POA: Diagnosis not present

## 2020-07-19 DIAGNOSIS — F419 Anxiety disorder, unspecified: Secondary | ICD-10-CM | POA: Insufficient documentation

## 2020-07-19 HISTORY — DX: Anxiety disorder, unspecified: F41.9

## 2020-07-19 NOTE — ED Triage Notes (Signed)
Pt presents by [facility] for evaluation of anxiety due to home life stressors. EMS reports pt has not been taking medications for anxiety or seeing therapy.

## 2020-07-29 ENCOUNTER — Encounter (HOSPITAL_COMMUNITY): Payer: Self-pay

## 2020-07-29 ENCOUNTER — Emergency Department (HOSPITAL_COMMUNITY)
Admission: EM | Admit: 2020-07-29 | Discharge: 2020-07-30 | Disposition: A | Discharge status: Home / Self Care | Payer: Medicaid Other | Attending: Emergency Medicine | Admitting: Emergency Medicine

## 2020-07-29 DIAGNOSIS — R5381 Other malaise: Secondary | ICD-10-CM | POA: Insufficient documentation

## 2020-07-29 DIAGNOSIS — R112 Nausea with vomiting, unspecified: Secondary | ICD-10-CM | POA: Insufficient documentation

## 2020-07-29 DIAGNOSIS — Z5321 Procedure and treatment not carried out due to patient leaving prior to being seen by health care provider: Secondary | ICD-10-CM | POA: Diagnosis not present

## 2020-07-29 DIAGNOSIS — J029 Acute pharyngitis, unspecified: Secondary | ICD-10-CM | POA: Diagnosis not present

## 2020-07-29 LAB — CBC
HCT: 40.7 % (ref 36.0–46.0)
Hemoglobin: 12.9 g/dL (ref 12.0–15.0)
MCH: 27 pg (ref 26.0–34.0)
MCHC: 31.7 g/dL (ref 30.0–36.0)
MCV: 85.3 fL (ref 80.0–100.0)
Platelets: 319 10*3/uL (ref 150–400)
RBC: 4.77 MIL/uL (ref 3.87–5.11)
RDW: 13.2 % (ref 11.5–15.5)
WBC: 9.3 10*3/uL (ref 4.0–10.5)
nRBC: 0 % (ref 0.0–0.2)

## 2020-07-29 LAB — COMPREHENSIVE METABOLIC PANEL
ALT: 13 U/L (ref 0–44)
AST: 15 U/L (ref 15–41)
Albumin: 4.3 g/dL (ref 3.5–5.0)
Alkaline Phosphatase: 57 U/L (ref 38–126)
Anion gap: 13 (ref 5–15)
BUN: 14 mg/dL (ref 6–20)
CO2: 21 mmol/L — ABNORMAL LOW (ref 22–32)
Calcium: 9.7 mg/dL (ref 8.9–10.3)
Chloride: 103 mmol/L (ref 98–111)
Creatinine, Ser: 0.95 mg/dL (ref 0.44–1.00)
GFR calc Af Amer: >60 mL/min (ref >60)
GFR calc non Af Amer: >60 mL/min (ref >60)
Glucose, Bld: 99 mg/dL (ref 70–99)
Potassium: 3.7 mmol/L (ref 3.5–5.1)
Sodium: 137 mmol/L (ref 135–145)
Total Bilirubin: 1.8 mg/dL — ABNORMAL HIGH (ref 0.3–1.2)
Total Protein: 8.3 g/dL — ABNORMAL HIGH (ref 6.5–8.1)

## 2020-07-29 LAB — I-STAT BETA HCG BLOOD, ED (MC, WL, AP ONLY): I-stat hCG, quantitative: <5 m[IU]/mL (ref <5)

## 2020-07-29 LAB — LIPASE, BLOOD: Lipase: 22 U/L (ref 11–51)

## 2020-07-29 NOTE — ED Notes (Signed)
Called for pt multiple times. No answer.

## 2020-07-29 NOTE — ED Triage Notes (Signed)
Pt arrives POV for eval of covid sx. States sore throat N/V, malaise x 2 days. Denies known contacts

## 2020-09-03 ENCOUNTER — Other Ambulatory Visit: Payer: Self-pay

## 2020-09-03 ENCOUNTER — Inpatient Hospital Stay (HOSPITAL_COMMUNITY)
Admission: AD | Admit: 2020-09-03 | Discharge: 2020-09-06 | DRG: 885 | Disposition: A | Discharge status: Home / Self Care | Payer: Medicaid Other | Source: Intra-hospital | Attending: Psychiatry | Admitting: Psychiatry

## 2020-09-03 ENCOUNTER — Encounter (HOSPITAL_COMMUNITY): Payer: Self-pay | Admitting: Psychiatry

## 2020-09-03 ENCOUNTER — Encounter (HOSPITAL_COMMUNITY): Payer: Self-pay | Admitting: Emergency Medicine

## 2020-09-03 ENCOUNTER — Emergency Department (HOSPITAL_COMMUNITY)
Admission: EM | Admit: 2020-09-03 | Discharge: 2020-09-03 | Disposition: A | Discharge status: Other Acute Inpatient Hospital | Payer: Medicaid Other | Attending: Emergency Medicine | Admitting: Emergency Medicine

## 2020-09-03 DIAGNOSIS — G44209 Tension-type headache, unspecified, not intractable: Secondary | ICD-10-CM | POA: Diagnosis present

## 2020-09-03 DIAGNOSIS — F332 Major depressive disorder, recurrent severe without psychotic features: Secondary | ICD-10-CM | POA: Diagnosis present

## 2020-09-03 DIAGNOSIS — R45851 Suicidal ideations: Secondary | ICD-10-CM | POA: Diagnosis not present

## 2020-09-03 DIAGNOSIS — F411 Generalized anxiety disorder: Secondary | ICD-10-CM | POA: Diagnosis present

## 2020-09-03 DIAGNOSIS — Z915 Personal history of self-harm: Secondary | ICD-10-CM

## 2020-09-03 DIAGNOSIS — T50902A Poisoning by unspecified drugs, medicaments and biological substances, intentional self-harm, initial encounter: Secondary | ICD-10-CM | POA: Diagnosis not present

## 2020-09-03 DIAGNOSIS — F322 Major depressive disorder, single episode, severe without psychotic features: Secondary | ICD-10-CM

## 2020-09-03 DIAGNOSIS — T1491XA Suicide attempt, initial encounter: Secondary | ICD-10-CM

## 2020-09-03 DIAGNOSIS — G4700 Insomnia, unspecified: Secondary | ICD-10-CM | POA: Diagnosis present

## 2020-09-03 DIAGNOSIS — F4310 Post-traumatic stress disorder, unspecified: Secondary | ICD-10-CM | POA: Insufficient documentation

## 2020-09-03 DIAGNOSIS — Z20822 Contact with and (suspected) exposure to covid-19: Secondary | ICD-10-CM | POA: Insufficient documentation

## 2020-09-03 LAB — CBC WITH DIFFERENTIAL/PLATELET
Abs Immature Granulocytes: 0.01 10*3/uL (ref 0.00–0.07)
Basophils Absolute: 0 10*3/uL (ref 0.0–0.1)
Basophils Relative: 1 %
Eosinophils Absolute: 0.1 10*3/uL (ref 0.0–0.5)
Eosinophils Relative: 1 %
HCT: 39.1 % (ref 36.0–46.0)
Hemoglobin: 12.9 g/dL (ref 12.0–15.0)
Immature Granulocytes: 0 %
Lymphocytes Relative: 28 %
Lymphs Abs: 2.3 10*3/uL (ref 0.7–4.0)
MCH: 28.4 pg (ref 26.0–34.0)
MCHC: 33 g/dL (ref 30.0–36.0)
MCV: 85.9 fL (ref 80.0–100.0)
Monocytes Absolute: 0.8 10*3/uL (ref 0.1–1.0)
Monocytes Relative: 10 %
Neutro Abs: 4.9 10*3/uL (ref 1.7–7.7)
Neutrophils Relative %: 60 %
Platelets: 347 10*3/uL (ref 150–400)
RBC: 4.55 MIL/uL (ref 3.87–5.11)
RDW: 14.2 % (ref 11.5–15.5)
WBC: 8.1 10*3/uL (ref 4.0–10.5)
nRBC: 0 % (ref 0.0–0.2)

## 2020-09-03 LAB — COMPREHENSIVE METABOLIC PANEL
ALT: 10 U/L (ref 0–44)
AST: 13 U/L — ABNORMAL LOW (ref 15–41)
Albumin: 4 g/dL (ref 3.5–5.0)
Alkaline Phosphatase: 53 U/L (ref 38–126)
Anion gap: 12 (ref 5–15)
BUN: 12 mg/dL (ref 6–20)
CO2: 25 mmol/L (ref 22–32)
Calcium: 9.5 mg/dL (ref 8.9–10.3)
Chloride: 103 mmol/L (ref 98–111)
Creatinine, Ser: 0.71 mg/dL (ref 0.44–1.00)
GFR calc Af Amer: >60 mL/min (ref >60)
GFR calc non Af Amer: >60 mL/min (ref >60)
Glucose, Bld: 82 mg/dL (ref 70–99)
Potassium: 3.3 mmol/L — ABNORMAL LOW (ref 3.5–5.1)
Sodium: 140 mmol/L (ref 135–145)
Total Bilirubin: 1 mg/dL (ref 0.3–1.2)
Total Protein: 7.8 g/dL (ref 6.5–8.1)

## 2020-09-03 LAB — I-STAT BETA HCG BLOOD, ED (MC, WL, AP ONLY): I-stat hCG, quantitative: <5 m[IU]/mL (ref <5)

## 2020-09-03 LAB — SARS CORONAVIRUS 2 BY RT PCR (HOSPITAL ORDER, PERFORMED IN ~~LOC~~ HOSPITAL LAB): SARS Coronavirus 2: NEGATIVE

## 2020-09-03 LAB — SALICYLATE LEVEL: Salicylate Lvl: <7 mg/dL — ABNORMAL LOW (ref 7.0–30.0)

## 2020-09-03 LAB — ETHANOL: Alcohol, Ethyl (B): <10 mg/dL (ref <10)

## 2020-09-03 LAB — CBG MONITORING, ED: Glucose-Capillary: 71 mg/dL (ref 70–99)

## 2020-09-03 LAB — RAPID URINE DRUG SCREEN, HOSP PERFORMED
Amphetamines: NOT DETECTED
Barbiturates: NOT DETECTED
Benzodiazepines: NOT DETECTED
Cocaine: NOT DETECTED
Opiates: NOT DETECTED
Tetrahydrocannabinol: NOT DETECTED

## 2020-09-03 LAB — ACETAMINOPHEN LEVEL
Acetaminophen (Tylenol), Serum: <10 ug/mL — ABNORMAL LOW (ref 10–30)
Acetaminophen (Tylenol), Serum: <10 ug/mL — ABNORMAL LOW (ref 10–30)

## 2020-09-03 MED ORDER — MAGNESIUM HYDROXIDE 400 MG/5ML PO SUSP: 30.0000 mL | Freq: Every day | ORAL | Status: DC | PRN

## 2020-09-03 MED ORDER — TRAZODONE HCL 50 MG PO TABS
50.0000 mg | ORAL_TABLET | Freq: Every evening | ORAL | Status: DC | PRN
  Administered 2020-09-05: 50 mg via ORAL
  Filled 2020-09-03 (×2): qty 1

## 2020-09-03 MED ORDER — ACETAMINOPHEN 325 MG PO TABS: 650.0000 mg | ORAL_TABLET | Freq: Four times a day (QID) | ORAL | Status: DC | PRN

## 2020-09-03 MED ORDER — MIRTAZAPINE 7.5 MG PO TABS: 7.5000 mg | ORAL_TABLET | Freq: Every day | ORAL | Status: DC

## 2020-09-03 MED ORDER — ALUM & MAG HYDROXIDE-SIMETH 200-200-20 MG/5ML PO SUSP: 30.0000 mL | ORAL | Status: DC | PRN
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
## 2020-09-03 NOTE — Care Management (Signed)
Writer informed the ER RN that the patient can be accepted to [facility] Room 304-02 at [facility] to the service of MD Clary.  Report may be called to [phone] when repeat labs are complete and resulted.

## 2020-09-03 NOTE — ED Notes (Signed)
Pt alert, resting in bed.  Sitter at bedside. No needs expressed by pt.  Pt calm and cooperative at this time, will continue to monitor.

## 2020-09-03 NOTE — ED Provider Notes (Signed)
[facility]-EMERGENCY DEPT Provider Note   CSN: [id number] Arrival date & time: 09/03/20  0211     History Chief Complaint  Patient presents with  . Drug Overdose    Linda Bates is a 19 y.o. female.  The history is provided by the patient.  Drug Overdose This is a new problem. Episode onset: just prior to arrival. The problem occurs constantly. The problem has not changed since onset.Pertinent negatives include no chest pain, no abdominal pain, no headaches and no shortness of breath. Nothing aggravates the symptoms. Nothing relieves the symptoms. She has tried nothing for the symptoms.  Patient presents after intentional overdose.  Patient reports taking large quantity of Tessalon Perles and Zyrtec.  Patient wrote a suicide note.  Patient denies any other complaints     Past Medical History:  Diagnosis Date  . Anxiety   . Medical history non-contributory     Patient Active Problem List   Diagnosis Date Noted  . Tension headache 11/20/2016  . Migraine without aura and without status migrainosus, not intractable 11/20/2016  . Anxiety state 11/20/2016    Past Surgical History:  Procedure Laterality Date  . NO PAST SURGERIES       OB History    Gravida  1   Para      Term      Preterm      AB      Living        SAB      TAB      Ectopic      Multiple      Live Births              Family History  Problem Relation Age of Onset  . Migraines Mother   . Heart attack Paternal Grandfather     Social History   Tobacco Use  . Smoking status: Never Smoker  . Smokeless tobacco: Never Used  Vaping Use  . Vaping Use: Never used  Substance Use Topics  . Alcohol use: No  . Drug use: No    Home Medications Prior to Admission medications   Medication Sig Start Date End Date Taking? Authorizing Provider  benzonatate (TESSALON) 100 MG capsule Take 100 mg by mouth 3 (three) times daily. 08/23/20  Yes [provider]  TRI-LO-MARZIA 0.18/0.215/0.25 MG-25 MCG tab Take 1 tablet by mouth daily. 05/11/20  Yes [provider]  promethazine (PHENERGAN) 25 MG tablet Take 1 tablet (25 mg total) by mouth every 6 (six) hours as needed for nausea or vomiting. 02/01/20   Clayton Bibles C, CNM  scopolamine (TRANSDERM-SCOP) 1 MG/3DAYS Place 1 patch (1.5 mg total) onto the skin every 3 (three) days. 02/04/20   Calvert Cantor, CNM    Allergies    Patient has no known allergies.  Review of Systems   Review of Systems  Constitutional: Negative for fever.  Respiratory: Negative for shortness of breath.   Cardiovascular: Negative for chest pain.  Gastrointestinal: Negative for abdominal pain and vomiting.  Neurological: Negative for headaches.  Psychiatric/Behavioral: Positive for suicidal ideas.  All other systems reviewed and are negative.   Physical Exam Updated Vital Signs BP 114/71 (BP Location: Left Arm)   Pulse 70   Temp 98.4 F (36.9 C) (Oral)   Resp 18   LMP 12/08/2019   SpO2 100%   Physical Exam CONSTITUTIONAL: Well developed/well nourished HEAD: Normocephalic/atraumatic EYES: EOMI ENMT: Mucous membranes moist NECK: supple no meningeal signs SPINE/BACK:entire spine nontender CV:  S1/S2 noted, no murmurs/rubs/gallops noted LUNGS: Lungs are clear to auscultation bilaterally, no apparent distress ABDOMEN: soft, nontender NEURO: Pt is awake/alert/appropriate, moves all extremitiesx4.  No facial droop.   EXTREMITIES: pulses normal/equal, full ROM SKIN: warm, color normal PSYCH: Poor eye contact  ED Results / Procedures / Treatments   Labs (all labs ordered are listed, but only abnormal results are displayed) Labs Reviewed  COMPREHENSIVE METABOLIC PANEL - Abnormal; Notable for the following components:      Result Value   Potassium 3.3 (*)    AST 13 (*)    All other components within normal limits  ACETAMINOPHEN LEVEL - Abnormal; Notable for the following components:    Acetaminophen (Tylenol), Serum <10 (*)    All other components within normal limits  SALICYLATE LEVEL - Abnormal; Notable for the following components:   Salicylate Lvl <7.0 (*)    All other components within normal limits  ACETAMINOPHEN LEVEL - Abnormal; Notable for the following components:   Acetaminophen (Tylenol), Serum <10 (*)    All other components within normal limits  SARS CORONAVIRUS 2 BY RT PCR (HOSPITAL ORDER, PERFORMED IN [facility])  RAPID URINE DRUG SCREEN, HOSP PERFORMED  CBC WITH DIFFERENTIAL/PLATELET  ETHANOL  CBG MONITORING, ED  I-STAT BETA HCG BLOOD, ED ([facility], [facility], [facility] ONLY)    EKG EKG Interpretation  Date/Time:  Saturday September 03 2020 03:17:29 EDT Ventricular Rate:  71 PR Interval:    QRS Duration: 76 QT Interval:  351 QTC Calculation: 382 R Axis:   73 Text Interpretation: Sinus rhythm RSR' in V1 or V2, right VCD or RVH No previous ECGs available Confirmed by Zadie Rhine ([id number]) on 09/03/2020 3:43:06 AM   EKG Interpretation  Date/Time:  Saturday September 03 2020 06:06:43 EDT Ventricular Rate:  63 PR Interval:    QRS Duration: 78 QT Interval:  376 QTC Calculation: 385 R Axis:   72 Text Interpretation: Sinus rhythm RSR' in V1 or V2, right VCD or RVH Borderline T abnormalities, anterior leads No significant change since last tracing Confirmed by Zadie Rhine ([id number]) on 09/03/2020 6:30:49 AM       Radiology No results found.  Procedures Procedures    Medications Ordered in ED Medications - No data to display  ED Course  I have reviewed the triage vital signs and the nursing notes.  Pertinent labs   results that were available during my care of the patient were reviewed by me and considered in my medical decision making (see chart for details).    MDM Rules/Calculators/A&P                          4:45 AM Patient presents after intentional overdose.  She took Personnel officer.  Patient is currently  hemodynamically appropriate in no acute distress.  She agrees to be evaluated by behavioral health.  Will consult poison center 7:22 AM patient stable.  Repeat EKG unchanged.  Repeat APAP levels negative.  Patient denies any complaints.  Patient medically stable.  The patient has been placed in psychiatric observation due to the need to provide a safe environment for the patient while obtaining psychiatric consultation and evaluation, as well as ongoing medical and medication management to treat the patient's condition.  The patient has not been placed under full IVC at this time.  Final Clinical Impression(s) / ED Diagnoses Final diagnoses:  Intentional drug overdose, initial encounter (HCC)  Suicidal ideation    Rx / DC  Orders ED Discharge Orders    None       Zadie Rhine, MD 09/03/20 [phone]

## 2020-09-03 NOTE — ED Triage Notes (Signed)
19 yo female BIBA from home status post overdose on tessalon perles and zyrtec. Per Ems pt had prescriptions filled 10 days ago, there were 42 tessalon perles originally and 30 zyrtec pills. Both bottles are currently empty. Per EMS pt is not voicing any suicidal ideation at this time, but pt is not currently being cooperative with answering questions.    Vitals: bp 126/72 Hr 80 spo2 99 on RA rr 16  cbg 112

## 2020-09-03 NOTE — ED Notes (Signed)
[facility] outside [facility] ED to take pt to [facility]. Pt A&Ox4 and ambulatory. Pt dressed out in burgundy scrubs. Pt leaving with all belongings.
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
## 2020-09-03 NOTE — Progress Notes (Signed)
Pending repeat EKG and acetaminophen level being within normal limits - pt will be accepted to Room 304-02 at [facility] to the service of MD Clary.  Report may be called to [phone] when repeat labs are complete and resulted.

## 2020-09-03 NOTE — ED Notes (Signed)
Safe transport called to transport pt to [facility]

## 2020-09-03 NOTE — Tx Team (Signed)
Initial Treatment Plan 09/03/2020 6:51 PM Linda Bates HAL:[id number]    PATIENT STRESSORS: Other: PTSD   PATIENT STRENGTHS: Average or above average intelligence Communication skills Physical Health Supportive family/friends Work skills   PATIENT IDENTIFIED PROBLEMS:  Attempted OD: Suicidal ideation    Depression    PTSD                 DISCHARGE CRITERIA:  Improved stabilization in mood, thinking, and/or behavior Need for constant or close observation no longer present Reduction of life-threatening or endangering symptoms to within safe limits Verbal commitment to aftercare and medication compliance  PRELIMINARY DISCHARGE PLAN: Outpatient therapy Return to previous living arrangement Return to previous work or school arrangements  PATIENT/FAMILY INVOLVEMENT: This treatment plan has been presented to and reviewed with the patient, Linda Bates.The patient has been given the opportunity to ask questions and make suggestions.  Shela Nevin, RN 09/03/2020, 6:51 PM

## 2020-09-03 NOTE — ED Notes (Signed)
Spoke with Caryn Bee from poison control. It was recommended to watch pt for at least 6 h. Within this time watch for numbness and tingling around lips, dysrrythmias, seizures, and coma. It was advised to repeat ekg in 4 hrs along with repeat in Acetominophen level.

## 2020-09-04 DIAGNOSIS — F332 Major depressive disorder, recurrent severe without psychotic features: Principal | ICD-10-CM

## 2020-09-04 DIAGNOSIS — G44209 Tension-type headache, unspecified, not intractable: Secondary | ICD-10-CM

## 2020-09-04 DIAGNOSIS — F411 Generalized anxiety disorder: Secondary | ICD-10-CM

## 2020-09-04 DIAGNOSIS — T1491XA Suicide attempt, initial encounter: Secondary | ICD-10-CM

## 2020-09-04 LAB — LIPID PANEL
Cholesterol: 160 mg/dL (ref 0–200)
HDL: 42 mg/dL (ref >40)
LDL Cholesterol: 110 mg/dL — ABNORMAL HIGH (ref 0–99)
Total CHOL/HDL Ratio: 3.8 RATIO
Triglycerides: 39 mg/dL (ref <150)
VLDL: 8 mg/dL (ref 0–40)

## 2020-09-04 LAB — TSH: TSH: 0.789 u[IU]/mL (ref 0.350–4.500)

## 2020-09-04 MED ORDER — POTASSIUM CHLORIDE CRYS ER 20 MEQ PO TBCR: 20.0000 meq | EXTENDED_RELEASE_TABLET | Freq: Two times a day (BID) | ORAL | Status: DC

## 2020-09-04 MED ORDER — HYDROXYZINE HCL 25 MG PO TABS: 25.0000 mg | ORAL_TABLET | Freq: Three times a day (TID) | ORAL | Status: DC | PRN

## 2020-09-04 MED ORDER — SERTRALINE HCL 25 MG PO TABS
25.0000 mg | ORAL_TABLET | Freq: Every day | ORAL | Status: DC
  Administered 2020-09-04 – 2020-09-06 (×3): 25 mg via ORAL
  Filled 2020-09-04 (×6): qty 1

## 2020-09-04 MED ORDER — POTASSIUM CHLORIDE CRYS ER 20 MEQ PO TBCR
20.0000 meq | EXTENDED_RELEASE_TABLET | Freq: Two times a day (BID) | ORAL | Status: AC
  Administered 2020-09-04: 20 meq via ORAL
  Filled 2020-09-04: qty 1

## 2020-09-04 NOTE — BHH Group Notes (Signed)
Psychoeducational Group Note  Date:  09-04-20 Time:  1300  Group Topic/Focus:  Making Healthy Choices:   The focus of this group is to help patients identify negative/unhealthy choices they were using prior to admission and identify positive/healthier coping strategies to replace them upon discharge.  Participation Level:  Active  Participation Quality:  Appropriate  Affect:  Appropriate  Cognitive:  Oriented  Insight:  Improving  Engagement in Group:  Engaged  Additional Comments:  Pt was in group and rates his energy at a 5. Pt chose to leave the group and did not return.  Dione Housekeeper
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
## 2020-09-04 NOTE — Progress Notes (Addendum)
   09/04/20 2355  Psych Admission Type (Psych Patients Only)  Admission Status Voluntary  Psychosocial Assessment  Patient Complaints Anxiety  Eye Contact Brief  Facial Expression Flat  Affect Appropriate to circumstance  Speech Logical/coherent  Interaction Minimal  Motor Activity Other (Comment) (WDL)  Appearance/Hygiene Unremarkable  Behavior Characteristics Cooperative  Mood Depressed  Thought Process  Coherency WDL  Content WDL  Delusions None reported or observed  Perception WDL  Hallucination None reported or observed  Judgment Impaired  Confusion None  Danger to Self  Current suicidal ideation? Denies  Danger to Others  Danger to Others None reported or observed     09/04/20 2355  Psych Admission Type (Psych Patients Only)  Admission Status Voluntary  Psychosocial Assessment  Patient Complaints Anxiety  Eye Contact Brief  Facial Expression Flat  Affect Appropriate to circumstance  Speech Logical/coherent  Interaction Minimal  Motor Activity Other (Comment) (WDL)  Appearance/Hygiene Unremarkable  Behavior Characteristics Cooperative  Mood Depressed  Thought Process  Coherency WDL  Content WDL  Delusions None reported or observed  Perception WDL  Hallucination None reported or observed  Judgment Impaired  Confusion None  Danger to Self  Current suicidal ideation? Denies  Danger to Others  Danger to Others None reported or observed  D: Patient in dayroom reports she had a good day. Pt reports she is working on being there for her friends and more important herself. A:Support and encouragement provided as needed.  R: Patient remains safe on the unit. Will continue to monitor for safety and stability.

## 2020-09-04 NOTE — Progress Notes (Signed)
   09/04/20 0045  Psych Admission Type (Psych Patients Only)  Admission Status Voluntary  Psychosocial Assessment  Patient Complaints Anxiety  Eye Contact Brief  Facial Expression Flat  Affect Appropriate to circumstance  Speech Logical/coherent  Interaction Minimal  Motor Activity Other (Comment) (WDL)  Appearance/Hygiene Unremarkable  Behavior Characteristics Appropriate to situation  Mood Depressed  Thought Process  Coherency WDL  Content WDL  Delusions None reported or observed  Perception WDL  Hallucination None reported or observed  Judgment Impaired  Confusion None  Danger to Self  Current suicidal ideation? Denies  Danger to Others  Danger to Others None reported or observed

## 2020-09-04 NOTE — Progress Notes (Signed)
   09/04/20 0042  COVID-19 Daily Checkoff  Have you had a fever (temp > 37.80C/100F)  in the past 24 hours?  No  If you have had runny nose, nasal congestion, sneezing in the past 24 hours, has it worsened? No  COVID-19 EXPOSURE  Have you traveled outside the state in the past 14 days? No  Have you been in contact with someone with a confirmed diagnosis of COVID-19 or PUI in the past 14 days without wearing appropriate PPE? No  Have you been living in the same home as a person with confirmed diagnosis of COVID-19 or a PUI (household contact)? No  Have you been diagnosed with COVID-19? No

## 2020-09-04 NOTE — Progress Notes (Addendum)
   09/04/20 0745  Vital Signs  Temp 97.8 F (36.6 C)  Temp Source Oral  Pulse Rate (!) 106  Pulse Rate Source Dinamap  Resp 16  BP 121/69  BP Location Left Arm  BP Method Automatic  Patient Position (if appropriate) Sitting  Pain Assessment  Pain Scale 0-10  Pain Score 0   D: Patient denies SI/HI/AVH. Patient denies anxiety and depression.  Pt.'s grandmother called to check on her today. Patient attended group. A:  Patient took scheduled medicine.  Support and encouragement provided Routine safety checks conducted every 15 minutes. Patient  Informed to notify staff with any concerns.   R: Safety maintained.

## 2020-09-04 NOTE — Progress Notes (Signed)
BHH Group Notes:  (Nursing/MHT/Case Management/Adjunct)  Date:  09/04/2020  Time: 2015  Type of Therapy:  wrap up group  Participation Level:  Active  Participation Quality:  Appropriate, Attentive, Sharing and Supportive  Affect:  Appropriate  Cognitive:  Appropriate  Insight:  Improving  Engagement in Group:  Engaged  Modes of Intervention:  Clarification, Education and Support  Summary of Progress/Problems: Positive thinking and positive change were discussed.   Marcille Buffy 09/04/2020, 9:43 PM

## 2020-09-04 NOTE — BHH Group Notes (Addendum)
Adult Psychoeducational Group Not Date:  09/04/2020 Time:  1000-1045 Group Topic/Focus: PROGRESSIVE RELAXATION. A group where deep breathing is taught and tensing and relaxation muscle groups is used. Imagery is used as well.  Pts are asked to imagine 3 pillars that hold them up when they are not able to hold themselves up.  Participation Level:  Active  Participation Quality:  Appropriate  Affect:  Appropriate  Cognitive:  Oriented  Insight: Improving  Engagement in Group:  Engaged  Modes of Intervention:  Activity, Discussion, Education, and Support  Pt reports her energy at a 8. Stares what holds her up is "a lot of reasons".  BF, her cat and dog, and her cheerleading support her. What she has learned isPeace and happiness occur from within.  Additional Comments:Gizell Danser, Delorse Limber   09/04/2020
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
## 2020-09-05 NOTE — [facility] Suicide Risk Assessment (Signed)
[facility] INPATIENT:  Family/Significant Other Suicide Prevention Education  Suicide Prevention Education:  Education Completed; Wileen Duncanson [phone]), grandmother has been identified by the patient as the family member/significant other with whom the patient will be residing, and identified as the person(s) who will aid the patient in the event of a mental health crisis (suicidal ideations/suicide attempt).  With written consent from the patient, the family member/significant other has been provided the following suicide prevention education, prior to the and/or following the discharge of the patient.  The suicide prevention education provided includes the following:  Suicide risk factors  Suicide prevention and interventions  National Suicide Hotline telephone number  [facility] assessment telephone number  [facility] Emergency Assistance 911  [facility] and/or Residential Mobile Crisis Unit telephone number  Request made of family/significant other to:  Remove weapons (e.g., guns, rifles, knives), all items previously/currently identified as safety concern.    Remove drugs/medications (over-the-counter, prescriptions, illicit drugs), all items previously/currently identified as a safety concern.  CSW completed SPE with grandmother. She is very supportive and plans to be more attentive to Pt's changes in mood. Grandmother was able to confirm that Pt is receiving services, however there are concerns with Pt's recent lack of follow through. CSW discussed SPE information as outlined in the [employer] pamphlet. Grandmother confirmed that there are no weapons in th home. In addition, Grandmother agreed to making changes as where and how medications are distributed in the home, including OTC medication. Mrs. Teodoro  verbalizes understanding of the suicide prevention education information provided. The family member/significant other agrees to remove the items of safety  concern listed above.  Jacinta Shoe, MSW, LCSW 09/05/2020, 3:56 PM
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
## 2020-09-05 NOTE — Progress Notes (Signed)
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 She reports a seizure and vomiting at home, grandmother got concerned and called EMS. Poison control was contacted on 09/03/2020 at 5 am and she was cleared around 1:30 pm that day.   Subjective:  On 9/20, she reports her mood as "optomistic" and scoring it a 9/10. She endorses normal sleeping pattern and recently had 7-8 hours of sleep. She describes some difficulty with eating, but mostly because food options are limited here - does not report decreased appetite. She looks forward to going back home and talking to her grandmother, an individual she is particularly close to in her family. Reports adequate compliance with her medications and expresses desire to take them regularly. Currently reports only taking the Zoloft (and is not aware of being prescribed other medications that are prescribed as needed). Denies having any side effects. Denies difficulties with constipation, urination or any current pain symptoms. Denies anxiety other than some hesitancy with returning to classes at [employer] due to her absence. Denies any current feelings of guilt, sleeplessness, loss of interest in activities, suicidal ideation, homicidal ideation, or visual or auditory  hallucinations.   Objective:  Patient does not have delusions and does not appear to be responding to internal stimuli. She is fully alert and oriented. Patient is not forthright with history and has somewhat terse, limited responses. Mood and affect appear to be congruent; she appears to be progressing appropriately. Review of vital signs: blood pressure 130/71, pulse 94, temperature 98.6 F (37 C), temperature source Oral, resp. rate 16, height 5\' 4"  (1.626 m), weight 55.3 kg, last menstrual period 12/08/2019, SpO2 100 %.  On review of lab values she currently has normal TSH, undetected APAP serum levels, undetected salicylate levels, undetected ethyl alcohol levels, slightly above optimal LDL cholesterol at 110, normal CBC w/ diff, low K+ to 3.3 on CMP.   Phone conversation with grandmother 12/10/2019 @ [phone]: [phone] states that she is not comfortable with patient coming home today. She states the patient is minimizing her symptoms and trying to manipulate everyone so that she can come home. She states whenever providers thinks that patient is ready to come home, she would be picking her up. She states patient need to pull her mind together. She states patient hung up on her this morning because she was not agreeing with the patient's coming back home today.  Total Time spent with patient: 25 minutes  Past Psychiatric History: Depression, Anxiety  Past Medical History:  Past Medical History:  Diagnosis Date  . Anxiety   . Medical history non-contributory     Past Surgical History:  Procedure Laterality Date  . NO PAST SURGERIES     Family History:  Family History  Problem Relation Age of Onset  . Migraines Mother   . Heart attack Paternal Grandfather  Family Psychiatric  History: Migraines (mother), Heart Attack (paternal grandfather) Social History:  Social History   Substance and Sexual Activity  Alcohol Use No     Social History   Substance and Sexual Activity   Drug Use No    Social History   Socioeconomic History  . Marital status: Single    Spouse name: Not on file  . Number of children: Not on file  . Years of education: Not on file  . Highest education level: Not on file  Occupational History  . Not on file  Tobacco Use  . Smoking status: Never Smoker  . Smokeless tobacco: Never Used  Vaping Use  . Vaping Use: Never used  Substance and Sexual Activity  . Alcohol use: No  . Drug use: No  . Sexual activity: Never  Other Topics Concern  . Not on file  Social History Narrative   Antioinette is a 9 th grade student at [employer] She does well in school.   Lives with her paternal grandmother.     Social Determinants of Health   Financial Resource Strain:   . Difficulty of Paying Living Expenses: Not on file  Food Insecurity:   . Worried About Programme researcher, broadcasting/film/video in the Last Year: Not on file  . Ran Out of Food in the Last Year: Not on file  Transportation Needs:   . Lack of Transportation (Medical): Not on file  . Lack of Transportation (Non-Medical): Not on file  Physical Activity:   . Days of Exercise per Week: Not on file  . Minutes of Exercise per Session: Not on file  Stress:   . Feeling of Stress : Not on file  Social Connections:   . Frequency of Communication with Friends and Family: Not on file  . Frequency of Social Gatherings with Friends and Family: Not on file  . Attends Religious Services: Not on file  . Active Member of Clubs or Organizations: Not on file  . Attends Banker Meetings: Not on file  . Marital Status: Not on file   Additional Social History:                         Sleep: Good  Appetite:  Fair  Current Medications: Current Facility-Administered Medications  Medication Dose Route Frequency Provider Last Rate Last Admin  . acetaminophen (TYLENOL) tablet 650 mg  650 mg Oral Q6H PRN Charm Rings, NP      . alum & mag hydroxide-simeth  (MAALOX/MYLANTA) 200-200-20 MG/5ML suspension 30 mL  30 mL Oral Q4H PRN Charm Rings, NP      . hydrOXYzine (ATARAX/VISTARIL) tablet 25 mg  25 mg Oral TID PRN Christyan Reger, Geralynn Rile, MD      . magnesium hydroxide (MILK OF MAGNESIA) suspension 30 mL  30 mL Oral Daily PRN Charm Rings, NP      . sertraline (ZOLOFT) tablet 25 mg  25 mg Oral Daily Laylamarie Meuser, Geralynn Rile, MD   25 mg at 09/05/20 0805  . traZODone (DESYREL) tablet 50 mg  50 mg Oral QHS PRN Charm Rings, NP        Lab Results:  Results for orders placed or performed during the hospital encounter of 09/03/20 (from the past 48 hour(s))  TSH     Status: None   Collection Time: 09/04/20  5:41 PM  Result Value Ref Range   TSH 0.789 0.350 - 4.500 uIU/mL  Comment: Performed by a 3rd Generation assay with a functional sensitivity of <=0.01 uIU/mL. Performed at [facility], [address] [place], [place] [address]   Lipid panel     Status: Abnormal   Collection Time: 09/04/20  5:41 PM  Result Value Ref Range   Cholesterol 160 0 - 200 mg/dL   Triglycerides 39 <604<150 mg/dL   HDL 42 >54>40 mg/dL   Total CHOL/HDL Ratio 3.8 RATIO   VLDL 8 0 - 40 mg/dL   LDL Cholesterol 098110 (H) 0 - 99 mg/dL    Comment:        Total Cholesterol/HDL:CHD Risk Coronary Heart Disease Risk Table                     Men   Women  1/2 Average Risk   3.4   3.3  Average Risk       5.0   4.4  2 X Average Risk   9.6   7.1  3 X Average Risk  23.4   11.0        Use the calculated Patient Ratio above and the CHD Risk Table to determine the patient's CHD Risk.        ATP III CLASSIFICATION (LDL):  <100     mg/dL   Optimal  [id number]  mg/dL   Near or Above                    Optimal  130-159  mg/dL   Borderline  [id number]  mg/dL   High  >130>190     mg/dL   Very High Performed at [facility], [address] [place], [place] [address]     Blood Alcohol level:  Lab Results  Component Value Date   ETH <10 09/03/2020   ETH <5  08/24/2017    Metabolic Disorder Labs: No results found for: HGBA1C, MPG No results found for: PROLACTIN Lab Results  Component Value Date   CHOL 160 09/04/2020   TRIG 39 09/04/2020   HDL 42 09/04/2020   CHOLHDL 3.8 09/04/2020   VLDL 8 09/04/2020   LDLCALC 110 (H) 09/04/2020    Physical Findings: AIMS: Facial and Oral Movements Muscles of Facial Expression: None, normal Lips and Perioral Area: None, normal Jaw: None, normal Tongue: None, normal,Extremity Movements Upper (arms, wrists, hands, fingers): None, normal Lower (legs, knees, ankles, toes): None, normal, Trunk Movements Neck, shoulders, hips: None, normal, Overall Severity Severity of abnormal movements (highest score from questions above): None, normal Incapacitation due to abnormal movements: None, normal Patient's awareness of abnormal movements (rate only patient's report): No Awareness, Dental Status Current problems with teeth and/or dentures?: No Does patient usually wear dentures?: No  CIWA:    COWS:     Musculoskeletal: Strength & Muscle Tone: within normal limits Gait & Station: normal Patient leans: N/A  Psychiatric Specialty Exam: Physical Exam Constitutional:      Appearance: Normal appearance. She is normal weight.  HENT:     Nose: Nose normal.  Pulmonary:     Effort: Pulmonary effort is normal.  Neurological:     Mental Status: She is alert.  Psychiatric:        Attention and Perception: Attention and perception normal. She is attentive. She does not perceive auditory or visual hallucinations.        Mood and Affect: Mood and affect normal. Affect is not tearful.        Speech: Speech normal. Speech  is not rapid and pressured, delayed, slurred or tangential.        Behavior: Behavior normal. Behavior is not agitated, aggressive, hyperactive or combative. Behavior is cooperative.        Thought Content: Thought content normal. Thought content is not paranoid or delusional. Thought content  does not include homicidal or suicidal ideation. Thought content does not include homicidal or suicidal plan.        Cognition and Memory: Cognition and memory normal.        Judgment: Judgment normal.     Review of Systems  Constitutional: Negative for fatigue.  HENT: Negative for hearing loss and tinnitus.   Eyes: Negative for visual disturbance.  Respiratory: Negative for shortness of breath.   Cardiovascular: Negative for chest pain.  Gastrointestinal: Negative for abdominal distention, abdominal pain, diarrhea, nausea and vomiting.  Genitourinary: Negative for difficulty urinating and dysuria.  Neurological: Negative for tremors, seizures and weakness.  Psychiatric/Behavioral: Negative for agitation, behavioral problems, hallucinations and suicidal ideas. The patient is not hyperactive.     Blood pressure 130/71, pulse 94, temperature 98.6 F (37 C), temperature source Oral, resp. rate 16, height 5\' 4"  (1.626 m), weight 55.3 kg, last menstrual period 12/08/2019, SpO2 100 %, unknown if currently breastfeeding.Body mass index is 20.94 kg/m.  General Appearance: Casual and Fairly Groomed  Eye Contact:  Good  Speech:  Normal Rate  Volume:  Normal  Mood:  Euthymic  Affect:  Appropriate, Congruent and Full Range  Thought Process:  Goal Directed and Linear  Orientation:  Full (Time, Place, and Person)  Thought Content:  Logical  Suicidal Thoughts:  No  Homicidal Thoughts:  No  Memory:  Immediate;   Good Recent;   Good Remote;   Good  Judgement:  Fair  Insight:  Fair  Psychomotor Activity:  Normal  Concentration:  Concentration: Good  Recall:  Good  Fund of Knowledge:  Good  Language:  Good  Akathisia:  Negative  Handed:  Right  AIMS (if indicated):     Assets:  Communication Skills Desire for Improvement Housing Resilience Social Support Talents/Skills Vocational/Educational  ADL's:  Intact  Cognition:  WNL  Sleep:  Number of Hours: 6.75     Assessment:   Linda Bates is a 19 yo AA F, heterosexual, lives with grandmother & younger sister, 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 She reports a seizure and vomiting at home, grandmother got concerned and called EMS.   Diagnosis:  1. Major Depressive Disorder 2. Anxiety  Treatment Plan Summary: Daily contact with patient to assess and evaluate symptoms and progress in treatment.  Plan:  Scheduled medications:  1. Continue Zoloft 25 mg for depressed mood - KCl given on 9/19 (20 mg x 2)   PRN:  2. Tylenol 650 mg Po 6H PRN for mild pain  3. Vistaril 25 mg Po TID PRN for anxiety. 4. Milk of magnesia 30 ml Po PRN for mild constipations. 5. Trazodone 50 mg Po QHS PRN for insomnia.  Psychosocial interventions:  6. Encouragement to attend group therapies. 7. Disposition in progress.   10/19, MD 09/05/2020, 11:56 AM

## 2020-09-05 NOTE — Tx Team (Signed)
Interdisciplinary Treatment and Diagnostic Plan Update  09/05/2020 Time of Session: 9:30am Teisha Trowbridge MRN: [id number]  Principal Diagnosis: Major depressive disorder, recurrent severe without psychotic features ([place])  Secondary Diagnoses: Principal Problem:   Major depressive disorder, recurrent severe without psychotic features ([place]) Active Problems:   Tension headache   Anxiety state   Suicide attempt ([place])   Current Medications:  Current Facility-Administered Medications  Medication Dose Route Frequency Provider Last Rate Last Admin  . acetaminophen (TYLENOL) tablet 650 mg  650 mg Oral Q6H PRN Patrecia Pour, NP      . alum & mag hydroxide-simeth (MAALOX/MYLANTA) 200-200-20 MG/5ML suspension 30 mL  30 mL Oral Q4H PRN Patrecia Pour, NP      . hydrOXYzine (ATARAX/VISTARIL) tablet 25 mg  25 mg Oral TID PRN Dagar, Timaya Bojarski Staggers, MD      . magnesium hydroxide (MILK OF MAGNESIA) suspension 30 mL  30 mL Oral Daily PRN Patrecia Pour, NP      . sertraline (ZOLOFT) tablet 25 mg  25 mg Oral Daily Dagar, Dede Dobesh Staggers, MD   25 mg at 09/05/20 0805  . traZODone (DESYREL) tablet 50 mg  50 mg Oral QHS PRN Patrecia Pour, NP       PTA Medications: Medications Prior to Admission  Medication Sig Dispense Refill Last Dose  . promethazine (PHENERGAN) 25 MG tablet Take 1 tablet (25 mg total) by mouth every 6 (six) hours as needed for nausea or vomiting. (Patient not taking: Reported on 09/05/2020) 30 tablet 0   . scopolamine (TRANSDERM-SCOP) 1 MG/3DAYS Place 1 patch (1.5 mg total) onto the skin every 3 (three) days. (Patient not taking: Reported on 09/05/2020) 10 patch 12   . TRI-LO-MARZIA 0.18/0.215/0.25 MG-25 MCG tab Take 1 tablet by mouth daily.       Patient Stressors: Other: PTSD  Patient Strengths: Average or above average intelligence Communication skills Physical Health Supportive family/friends Work skills  Treatment Modalities: Medication Management, Group therapy, Case management,  1  to 1 session with clinician, Psychoeducation, Recreational therapy.   Physician Treatment Plan for Primary Diagnosis: Major depressive disorder, recurrent severe without psychotic features ([place]) Long Term Goal(s): Improvement in symptoms so as ready for discharge Improvement in symptoms so as ready for discharge   Short Term Goals: Ability to verbalize feelings will improve Ability to disclose and discuss suicidal ideas Compliance with prescribed medications will improve Ability to identify changes in lifestyle to reduce recurrence of condition will improve Ability to demonstrate self-control will improve Ability to identify and develop effective coping behaviors will improve Ability to maintain clinical measurements within normal limits will improve  Medication Management: Evaluate patient's response, side effects, and tolerance of medication regimen.  Therapeutic Interventions: 1 to 1 sessions, Unit Group sessions and Medication administration.  Evaluation of Outcomes: Not Met  Physician Treatment Plan for Secondary Diagnosis: Principal Problem:   Major depressive disorder, recurrent severe without psychotic features ([place]) Active Problems:   Tension headache   Anxiety state   Suicide attempt ([place])  Long Term Goal(s): Improvement in symptoms so as ready for discharge Improvement in symptoms so as ready for discharge   Short Term Goals: Ability to verbalize feelings will improve Ability to disclose and discuss suicidal ideas Compliance with prescribed medications will improve Ability to identify changes in lifestyle to reduce recurrence of condition will improve Ability to demonstrate self-control will improve Ability to identify and develop effective coping behaviors will improve Ability to maintain clinical measurements within normal limits will improve  Medication Management: Evaluate patient's response, side effects, and tolerance of medication regimen.  Therapeutic  Interventions: 1 to 1 sessions, Unit Group sessions and Medication administration.  Evaluation of Outcomes: Not Met   RN Treatment Plan for Primary Diagnosis: Major depressive disorder, recurrent severe without psychotic features ([place]) Long Term Goal(s): Knowledge of disease and therapeutic regimen to maintain health will improve  Short Term Goals: Ability to remain free from injury will improve, Ability to verbalize frustration and anger appropriately will improve, Ability to identify and develop effective coping behaviors will improve and Compliance with prescribed medications will improve  Medication Management: RN will administer medications as ordered by provider, will assess and evaluate patient's response and provide education to patient for prescribed medication. RN will report any adverse and/or side effects to prescribing provider.  Therapeutic Interventions: 1 on 1 counseling sessions, Psychoeducation, Medication administration, Evaluate responses to treatment, Monitor vital signs and CBGs as ordered, Perform/monitor CIWA, COWS, AIMS and Fall Risk screenings as ordered, Perform wound care treatments as ordered.  Evaluation of Outcomes: Not Met   LCSW Treatment Plan for Primary Diagnosis: Major depressive disorder, recurrent severe without psychotic features ([place]) Long Term Goal(s): Safe transition to appropriate next level of care at discharge, Engage patient in therapeutic group addressing interpersonal concerns.  Short Term Goals: Engage patient in aftercare planning with referrals and resources, Increase social support, Increase ability to appropriately verbalize feelings and Increase skills for wellness and recovery  Therapeutic Interventions: Assess for all discharge needs, 1 to 1 time with Social worker, Explore available resources and support systems, Assess for adequacy in community support network, Educate family and significant other(s) on suicide prevention, Complete  Psychosocial Assessment, Interpersonal group therapy.  Evaluation of Outcomes: Not Met   Progress in Treatment: Attending groups: Yes. Participating in groups: Yes. Taking medication as prescribed: Yes. Toleration medication: Yes. Family/Significant other contact made: No, will contact:  if consent is given  Patient understands diagnosis: Yes. Discussing patient identified problems/goals with staff: Yes. Medical problems stabilized or resolved: Yes. Denies suicidal/homicidal ideation: Yes. Issues/concerns per patient self-inventory: No.   New problem(s) identified: No, Describe:  none  New Short Term/Long Term Goal(s): medication stabilization, elimination of SI thoughts, development of comprehensive mental wellness plan.   Patient Goals:  "To talk to a doctor and to see a therapist when I leave"  Discharge Plan or Barriers:  Patient recently admitted. CSW will continue to follow and assess for appropriate referrals and possible discharge planning.   Reason for Continuation of Hospitalization: Anxiety Depression Medication stabilization  Estimated Length of Stay: 3-5 days  Attendees: Patient: Lenay Lovejoy 09/05/2020   Physician:  09/05/2020   Nursing:  09/05/2020   RN Care Manager: 09/05/2020  Social Worker: Darletta Moll, LCSW 09/05/2020   Recreational Therapist:  09/05/2020   Other: Baruch Gouty, NP 09/05/2020   Other: Verdis Frederickson, LCSW 09/05/2020   Other: 09/05/2020      Scribe for Treatment Team: Vassie Moselle, LCSW 09/05/2020 11:07 AM

## 2020-09-05 NOTE — Progress Notes (Signed)
Recreation Therapy Notes  Date:  9.20.21 Time: 0930 Location: [address] Dayroom  Group Topic: Stress Management  Goal Area(s) Addresses:  Patient will identify positive stress management techniques. Patient will identify benefits of using stress management post d/c.  Behavioral Response: Engaged  Intervention: Stress Management  Activity:  Meditation.  LRT played a meditation that focused on making the most of your day and making the most of each moment.  Patients were to listen and follow as meditation played to fully engage in activity.    Education:  Stress Management, Discharge Planning.   Education Outcome: Acknowledges Education  Clinical Observations/Feedback: Pt attended and participated in activity.    Caroll Rancher, LRT/CTRS         Lillia Abed, Linkon Siverson A 09/05/2020 11:01 AM

## 2020-09-05 NOTE — Progress Notes (Signed)
   09/05/20 0625  Vital Signs  Pulse Rate 94  BP 130/71  BP Location Left Arm  BP Method Automatic  Patient Position (if appropriate) Standing   D: Patient denies SI/HI/AVH. Patient denies anxiety and depression.  A:  Patient took scheduled medicine.  Support and encouragement provided Routine safety checks conducted every 15 minutes. Patient  Informed to notify staff with any concerns.   R:  Safety maintained.

## 2020-09-05 NOTE — BHH Group Notes (Signed)
Occupational Therapy Group Note Date: 09/05/2020 Group Topic/Focus: Communication Skills  Group Description: Group encouraged increased engagement and participation through discussion focused on communication styles. Patients were educated on the different styles of communication including passive, aggressive, assertive, and passive-aggressive communication. Group members shared and reflected on which styles they most often find themselves communicating in and brainstormed strategies on how to transition and practice a more assertive approach. Further discussion explored how to use assertiveness skills and strategies to further advocate and ask questions as it relates to their treatment plan and mental health.  Participation Level: Minimal   Participation Quality: Independent   Behavior: Guarded   Speech/Thought Process: Focused   Affect/Mood: Constricted   Insight: Limited   Judgement: Limited   Individualization: Linda Bates was present in group space, however was non-verbal and appeared largely distracted from group discussion. Pt was often observed looking out the window and had difficulty making eye contact with this Clinical research associate and peers. Pt introduced herself, however was largely an observer for group duration.   Modes of Intervention: Discussion, Education and Socialization  Patient Response to Interventions:  Disengaged   Plan: Continue to engage patient in OT groups 2 - 3x/week.  09/05/2020  Donne Hazel, MOT, OTR/L

## 2020-09-06 MED ORDER — TRAZODONE HCL 50 MG PO TABS
50.0000 mg | ORAL_TABLET | Freq: Every evening | ORAL | 0 refills | Status: DC | PRN
Start: 2020-09-06 — End: 2020-09-07

## 2020-09-06 MED ORDER — SERTRALINE HCL 25 MG PO TABS
25.0000 mg | ORAL_TABLET | Freq: Every day | ORAL | 0 refills | Status: DC
Start: 2020-09-07 — End: 2020-09-07

## 2020-09-06 MED ORDER — HYDROXYZINE HCL 25 MG PO TABS
25.0000 mg | ORAL_TABLET | Freq: Three times a day (TID) | ORAL | 0 refills | Status: DC | PRN
Start: 2020-09-06 — End: 2020-09-07

## 2020-09-06 NOTE — BHH Group Notes (Signed)
BHH Group Notes:  (Nursing/MHT/Case Management/Adjunct)  Date:  09/06/2020  Time:  4:02 AM  Type of Therapy:  Group Therapy  Participation Level:  Active  Participation Quality:  Appropriate  Affect:  Appropriate  Cognitive:  Appropriate  Insight:  Good  Engagement in Group:  Engaged  Modes of Intervention:  Discussion  Summary of Progress/Problems: Pt stated that she had a good day due to being able to go outside because she enjoys being active. She also enjoyed being able to have good conversations with other peers and she is excited about being discharged tomorrow.   Cresencio Reesor J Teela Narducci 09/06/2020, 4:02 AM

## 2020-09-06 NOTE — BHH Counselor (Signed)
CSW, MD, medical student, pt, and pt's grandmother had virtual family meeting. Pt and pt grandmother discussed concerns regarding pt's mental health. Grandmother is open to offering support to pt when she is depressed or suicidal. Pt is able to identify her therapist and neighbor/friend as supports she can use when depressed or suicidal. Team also discusses expectation for pt to communicate with her grandmother especially when depression or suicidal thoughts are worsening; pt is agreeable to increasing her communication. Team discusses d/c plans for 430p this afternoon, 09/06/20.

## 2020-09-06 NOTE — Progress Notes (Signed)
  [facility] Adult Case Management Discharge Plan :  Will you be returning to the same living situation after discharge:  Yes,  with grandma At discharge, do you have transportation home?: Yes,  grandma Do you have the ability to pay for your medications: Yes,  medicaid  Release of information consent forms completed and in the chart;  Patient's signature needed at discharge.  Patient to Follow up at:  Follow-up Information    [facility]. Go on 09/14/2020.   Specialty: Behavioral Health Why: You have a walk in appointment for therapy on 09/14/20 at 8:00 am.  You also have a walk in appointment on 10/05/20 at 12:30 pm.  These appointments will be held in person.  Please arrive 15 minutes prior to your appointment. Contact information: [address] [place] [address] [phone]       [facility]. Go on 09/07/2020.   Specialty: Professional Counselor Why: You have an appointment for assessment to re-establish with Grace [facility] for therapy services on 09/07/20 at 10:00 am.  This appointment will be held in person. Contact information: [address] [place] [address] [phone]               Next level of care provider has access to [facility] Link:yes  Safety Planning and Suicide Prevention discussed: Yes,  with grandmother     Has patient been referred to the Quitline?: N/A patient is not a smoker  Patient has been referred for addiction treatment: N/A  Erin Sons, LCSW 09/06/2020, 11:40 AM

## 2020-09-06 NOTE — Progress Notes (Signed)
   09/05/20 2200  Psych Admission Type (Psych Patients Only)  Admission Status Voluntary  Psychosocial Assessment  Patient Complaints Insomnia  Eye Contact Brief  Facial Expression Other (Comment) (wnl)  Affect Appropriate to circumstance  Speech Logical/coherent  Interaction Minimal  Motor Activity Other (Comment) (WDL)  Appearance/Hygiene Unremarkable  Behavior Characteristics Cooperative;Appropriate to situation  Mood Elated  Thought Process  Coherency WDL  Content WDL  Delusions None reported or observed  Perception WDL  Hallucination None reported or observed  Judgment Impaired  Confusion None  Danger to Self  Current suicidal ideation? Denies  Danger to Others  Danger to Others None reported or observed  Pt c/o insomnia Prn Trazodone given at HS and effective. Support and encouragement provided as needed.

## 2020-09-06 NOTE — BHH Suicide Risk Assessment (Signed)
[facility] Discharge Suicide Risk Assessment   Principal Problem: Major depressive disorder, recurrent severe without psychotic features [facility]) Discharge Diagnoses: Principal Problem:   Major depressive disorder, recurrent severe without psychotic features ([facility]) Active Problems:   Tension headache   Anxiety state   Suicide attempt ([facility])   Total Time spent with patient: 15 minutes  Musculoskeletal: Strength & Muscle Tone: within normal limits Gait & Station: normal Patient leans: N/A  Psychiatric Specialty Exam: Review of Systems  All other systems reviewed and are negative.   Blood pressure 115/71, pulse 83, temperature 98.3 F (36.8 C), temperature source Oral, resp. rate 16, height 5\' 4"  (1.626 m), weight 55.3 kg, last menstrual period 12/08/2019, SpO2 100 %, unknown if currently breastfeeding.Body mass index is 20.94 kg/m.  General Appearance: Casual  Eye Contact::  Good  Speech:  Normal Rate409  Volume:  Normal  Mood:  Euthymic  Affect:  Congruent  Thought Process:  Coherent and Descriptions of Associations: Intact  Orientation:  Full (Time, Place, and Person)  Thought Content:  Logical  Suicidal Thoughts:  No  Homicidal Thoughts:  No  Memory:  Immediate;   Fair Recent;   Fair Remote;   Fair  Judgement:  Intact  Insight:  Fair  Psychomotor Activity:  Normal  Concentration:  Good  Recall:  Good  Fund of Knowledge:Good  Language: Good  Akathisia:  Negative  Handed:  Right  AIMS (if indicated):     Assets:  Desire for Improvement Resilience  Sleep:  Number of Hours: 6.75  Cognition: WNL  ADL's:  Intact   Mental Status Per Nursing Assessment::   On Admission:  Suicidal ideation indicated by others, Self-harm behaviors  Demographic Factors:  Adolescent or young adult  Loss Factors: Loss of significant relationship  Historical Factors: Impulsivity  Risk Reduction Factors:   Living with another person, especially a relative and Positive social  support  Continued Clinical Symptoms:  Depression:   Impulsivity  Cognitive Features That Contribute To Risk:  None    Suicide Risk:  Minimal: No identifiable suicidal ideation.  Patients presenting with no risk factors but with morbid ruminations; may be classified as minimal risk based on the severity of the depressive symptoms   Follow-up Information    [facility]. Go on 09/14/2020.   Specialty: Behavioral Health Why: You have a walk in appointment for therapy on 09/14/20 at 8:00 am.  You also have a walk in appointment on 10/05/20 at 12:30 pm.  These appointments will be held in person.  Please arrive 15 minutes prior to your appointment. Contact information: [address] [place] [phone]              Plan Of Care/Follow-up recommendations:  Activity:  ad lib  [phone], MD 09/06/2020, 11:12 AM

## 2020-09-06 NOTE — Discharge Summary (Signed)
Physician Discharge Summary Note  Patient:  Linda Bates is an 19 y.o., female MRN:  [id number] DOB:  08-27-2001 Patient phone:  [phone] (home)  Patient address:   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 She reports a seizure and vomiting at home, grandmother got concerned and called EMS. Poison control was contacted on 09/03/2020 at 5 am and she was cleared around 1:30 pm. She was admitted for stabilization and further evaluation.  Principal Problem: Major depressive disorder, recurrent severe without psychotic features [facility]) Discharge Diagnoses: Principal Problem:   Major depressive disorder, recurrent severe without psychotic features (HCC) Active Problems:   Tension headache   Anxiety state   Suicide attempt [facility])   Past Psychiatric History:  Depression, Anxiety  Past Medical History:  Past Medical History:  Diagnosis Date  . Anxiety   . Medical history non-contributory     Past Surgical History:  Procedure Laterality Date  . NO PAST SURGERIES     Family History:  Family History  Problem Relation Age of Onset  . Migraines Mother   . Heart attack Paternal Grandfather    Family Psychiatric  History: Not aware of any family history. Social History:  Social History   Substance and Sexual Activity  Alcohol Use No     Social History   Substance and Sexual Activity  Drug Use No    Social History   Socioeconomic History  . Marital status: Single    Spouse name: Not on file  .  Number of children: Not on file  . Years of education: Not on file  . Highest education level: Not on file  Occupational History  . Not on file  Tobacco Use  . Smoking status: Never Smoker  . Smokeless tobacco: Never Used  Vaping Use  . Vaping Use: Never used  Substance and Sexual Activity  . Alcohol use: No  . Drug use: No  . Sexual activity: Never  Other Topics Concern  . Not on file  Social History Narrative   Antioinette is a 9 th grade student at [employer] She does well in school.   Lives with her paternal grandmother.     Social Determinants of Health   Financial Resource Strain:   . Difficulty of Paying Living Expenses: Not on file  Food Insecurity:   . Worried About Programme researcher, broadcasting/film/video in the Last Year: Not on file  . Ran Out of Food in the Last Year: Not on file  Transportation Needs:   . Lack of Transportation (Medical): Not on file  . Lack of Transportation (Non-Medical): Not on file  Physical Activity:   . Days of Exercise per Week: Not on file  . Minutes of Exercise per Session: Not on file  Stress:   . Feeling of Stress : Not on file  Social Connections:   . Frequency of Communication with Friends and Family: Not on file  . Frequency of Social Gatherings with Friends and Family: Not on file  . Attends Religious  Services: Not on file  . Active Member of Clubs or Organizations: Not on file  . Attends Banker Meetings: Not on file  . Marital Status: Not on file    Hospital Course:  Patient was started on Zoloft 25 mg for depressed mood and KCl 20 meq for low potassium. Vistaril 25 mg for anxiety and Trazodone 50 mg for insomnia were available PRN.  She did good on the unit, attended group therapies, was seen interacting with peers in day room. She denies any suicidal ideation and presents with better mood.  Family meeting was held on 09/06/2020 before discharge. Video call was done with Child psychotherapist, provider, medical  student, patient and grandmother. Grandmother spoke her concerns and want patient to see her therapist regularly, talk to her whenever it's required. Patient states she does not talk to grand mother because she works a lot, pay all the bills and she does not want to worry her. Patient also states yelling by grand mother also prevents her to have a good communication with her. Both agreed to be working on each other concerns and found family meeting helpful.  Discharge medications:  1. Zoloft 25 mg 2. Vistaril 25 mg 3. Trazodone 50 mg  Physical Findings: AIMS: Facial and Oral Movements Muscles of Facial Expression: None, normal Lips and Perioral Area: None, normal Jaw: None, normal Tongue: None, normal,Extremity Movements Upper (arms, wrists, hands, fingers): None, normal Lower (legs, knees, ankles, toes): None, normal, Trunk Movements Neck, shoulders, hips: None, normal, Overall Severity Severity of abnormal movements (highest score from questions above): None, normal Incapacitation due to abnormal movements: None, normal Patient's awareness of abnormal movements (rate only patient's report): No Awareness, Dental Status Current problems with teeth and/or dentures?: No Does patient usually wear dentures?: No  CIWA:    COWS:     Musculoskeletal: Strength & Muscle Tone: within normal limits Gait & Station: normal Patient leans: N/A  Psychiatric Specialty Exam: Physical Exam Constitutional:      Appearance: Normal appearance. She is normal weight.  HENT:     Nose: Nose normal.  Pulmonary:     Effort: Pulmonary effort is normal.  Neurological:     Mental Status: She is alert.  Psychiatric:        Attention and Perception: Attention and perception normal. She is attentive. She does not perceive auditory or visual hallucinations.        Mood and Affect: Mood and affect normal. Affect is not tearful.        Speech: Speech normal. Speech is not rapid and pressured, delayed, slurred  or tangential.        Behavior: Behavior normal. Behavior is not agitated, aggressive, hyperactive or combative. Behavior is cooperative.        Thought Content: Thought content normal. Thought content is not paranoid or delusional. Thought content does not include homicidal or suicidal ideation. Thought content does not include homicidal or suicidal plan.        Cognition and Memory: Cognition and memory normal.        Judgment: Judgment normal.     Review of Systems  Constitutional: Negative for fatigue.  HENT: Negative for hearing loss and tinnitus.   Eyes: Negative for visual disturbance.  Respiratory: Negative for shortness of breath.   Cardiovascular: Negative for chest pain.  Gastrointestinal: Negative for abdominal distention, abdominal pain, diarrhea, nausea and vomiting.  Genitourinary: Negative for difficulty urinating and dysuria.  Neurological: Negative for tremors, seizures and weakness.  Psychiatric/Behavioral: Negative  for agitation, behavioral problems, hallucinations and suicidal ideas. The patient is not hyperactive.     Blood pressure 115/71, pulse 83, temperature 98.3 F (36.8 C), temperature source Oral, resp. rate 16, height 5\' 4"  (1.626 m), weight 55.3 kg, last menstrual period 12/08/2019, SpO2 100 %, unknown if currently breastfeeding.Body mass index is 20.94 kg/m.  General Appearance: Casual  Eye Contact:  Good  Speech:  Normal Rate  Volume:  Normal  Mood:  Euthymic  Affect:  Congruent  Thought Process:  Linear and Descriptions of Associations: Intact  Orientation:  Full (Time, Place, and Person)  Thought Content:  Logical  Suicidal Thoughts:  No  Homicidal Thoughts:  No  Memory:  Immediate;   Good Recent;   Fair Remote;   Fair  Judgement:  Intact  Insight:  Fair  Psychomotor Activity:  Normal  Concentration:  Concentration: Good and Attention Span: Good  Recall:  Good  Fund of Knowledge:  Good  Language:  Good  Akathisia:  Negative  Handed:  Right   AIMS (if indicated):     Assets:  Desire for Improvement Housing Physical Health Resilience Social Support Talents/Skills Transportation Vocational/Educational  ADL's:  Intact  Cognition:  WNL  Sleep:  Number of Hours: 6.75        Has this patient used any form of tobacco in the last 30 days? (Cigarettes, Smokeless Tobacco, Cigars, and/or Pipes) Yes, N/A  Blood Alcohol level:  Lab Results  Component Value Date   ETH <10 09/03/2020   ETH <5 08/24/2017    Metabolic Disorder Labs:  No results found for: HGBA1C, MPG No results found for: PROLACTIN Lab Results  Component Value Date   CHOL 160 09/04/2020   TRIG 39 09/04/2020   HDL 42 09/04/2020   CHOLHDL 3.8 09/04/2020   VLDL 8 09/04/2020   LDLCALC 110 (H) 09/04/2020    See Psychiatric Specialty Exam and Suicide Risk Assessment completed by Attending Physician prior to discharge.  Discharge destination:  Home  Is patient on multiple antipsychotic therapies at discharge:  No   Has Patient had three or more failed trials of antipsychotic monotherapy by history:  No  Recommended Plan for Multiple Antipsychotic Therapies: NA  Discharge Instructions    Discharge patient   Complete by: As directed    Discharge disposition: 01-Home or Self Care   Discharge patient date: 09/06/2020     Allergies as of 09/06/2020   No Known Allergies     Medication List    STOP taking these medications   promethazine 25 MG tablet Commonly known as: PHENERGAN   scopolamine 1 MG/3DAYS Commonly known as: TRANSDERM-SCOP     TAKE these medications     Indication  hydrOXYzine 25 MG tablet Commonly known as: ATARAX/VISTARIL Take 1 tablet (25 mg total) by mouth 3 (three) times daily as needed for anxiety.  Indication: Feeling Anxious   sertraline 25 MG tablet Commonly known as: ZOLOFT Take 1 tablet (25 mg total) by mouth daily. Start taking on: September 07, 2020  Indication: Major Depressive Disorder   traZODone 50 MG  tablet Commonly known as: DESYREL Take 1 tablet (50 mg total) by mouth at bedtime as needed for sleep.  Indication: Trouble Sleeping   Tri-Lo-Marzia 0.18/0.215/0.25 MG-25 MCG tab Generic drug: Norgestimate-Ethinyl Estradiol Triphasic Take 1 tablet by mouth daily.  Indication: Birth Control Treatment       Follow-up Information    [facility]. Go on 09/14/2020.   Specialty: Behavioral Health Why: You have  a walk in appointment for therapy on 09/14/20 at 8:00 am.  You also have a walk in appointment on 10/05/20 at 12:30 pm.  These appointments will be held in person.  Please arrive 15 minutes prior to your appointment. Contact information: [address] [place] [address] [phone]       [facility]. Schedule an appointment as soon as possible for a visit.   Specialty: Professional Counselor Why: Please call to schedule an appointment with your therapist Grace Isaac. Contact information: [address] [place] [address] [phone]               Follow-up recommendations:  Activity:  Normal Diet:  Normal  Comments:  Prescriptions given at discharge.Patient agreeable to plan. Given opportunity to ask questions. Appears to feel comfortable with discharge denies any current suicidal or homicidal thought. Patient is also instructed prior to discharge to: Take all medications as prescribed by his/her mental healthcare provider. Report any adverse effects and or reactions from the medicines to her outpatient provider promptly. Patient has been instructed & cautioned: To not engage in alcohol and or illegal drug use while on prescription medicines. In the event of worsening symptoms, patient is instructed to call the crisis hotline, 911 and or go to the nearest ED for appropriate evaluation and treatment of symptoms. To follow-up with her primary care provider for your other medical issues, concerns and or health care  needs.  Signed: Arnoldo Lenis, MD 09/06/2020, 11:36 AM

## 2020-09-07 ENCOUNTER — Other Ambulatory Visit (HOSPITAL_COMMUNITY): Payer: Self-pay | Admitting: Psychiatry

## 2020-09-07 MED ORDER — SERTRALINE HCL 25 MG PO TABS
25.0000 mg | ORAL_TABLET | Freq: Every day | ORAL | 0 refills | Status: AC
Start: 2020-09-07 — End: ?

## 2020-09-07 MED ORDER — HYDROXYZINE HCL 25 MG PO TABS
25.0000 mg | ORAL_TABLET | Freq: Three times a day (TID) | ORAL | 0 refills | Status: AC | PRN
Start: 2020-09-07 — End: ?

## 2020-09-07 MED ORDER — TRAZODONE HCL 50 MG PO TABS
50.0000 mg | ORAL_TABLET | Freq: Every evening | ORAL | 0 refills | Status: AC | PRN
Start: 2020-09-07 — End: ?

## 2020-10-03 ENCOUNTER — Other Ambulatory Visit (HOSPITAL_COMMUNITY): Payer: Self-pay | Admitting: Psychiatry

## 2020-12-15 IMAGING — US US OB < 14 WEEKS - US OB TV
1 series · 15 of 28 positions shown · non-contrast
Comparison: None.

CLINICAL DATA: Pelvic pain

EXAM:
OBSTETRIC <14 WK US AND TRANSVAGINAL OB US
TECHNIQUE: Both transabdominal and transvaginal ultrasound examinations were
performed for complete evaluation of the gestation as well as the
maternal uterus, adnexal regions, and pelvic cul-de-sac.
Transvaginal technique was performed to assess early pregnancy.

[Series 1: us ob < 14 weeks - us ob tv · 15 of 34 slices shown]
[im 1/34]
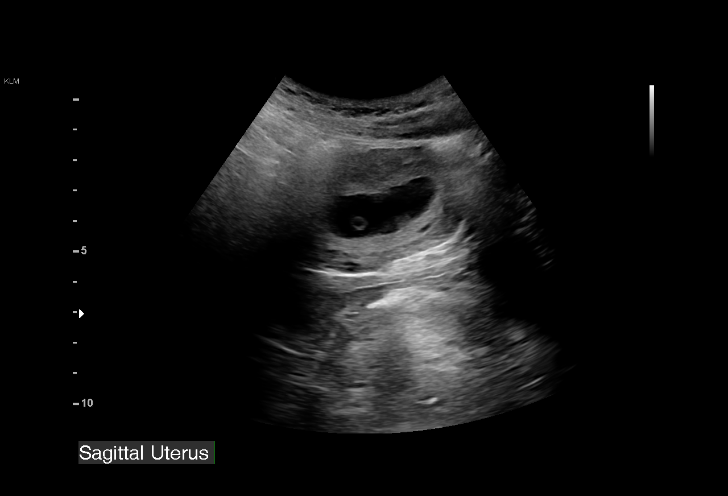
[im 3/34]
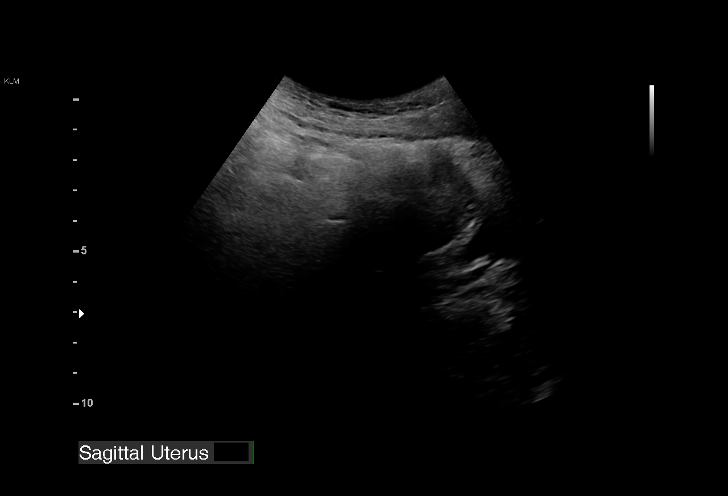
[im 5/34]
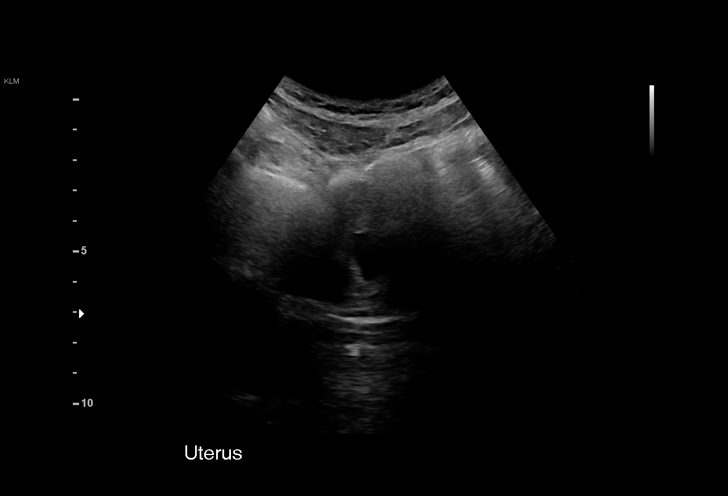
[im 8/34]
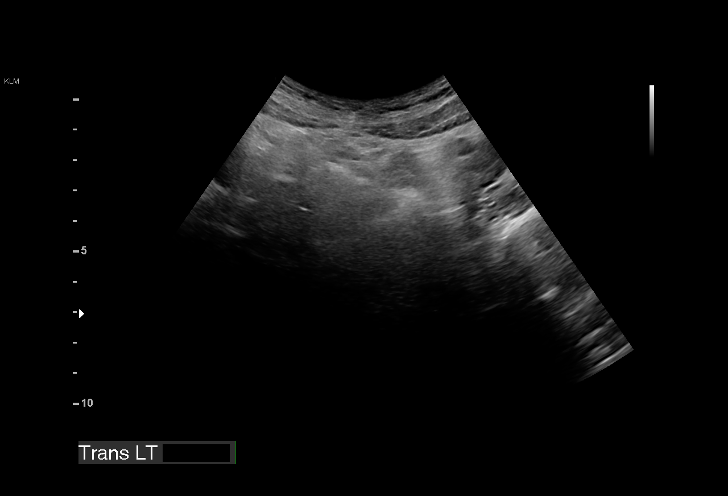
[im 10/34]
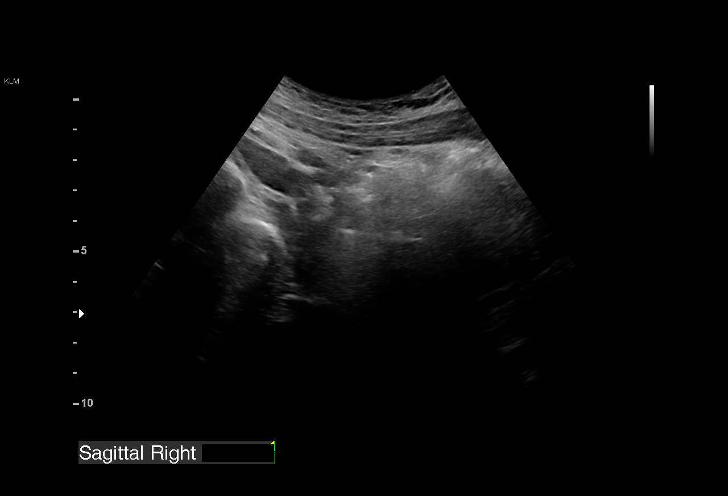
[im 13/34]
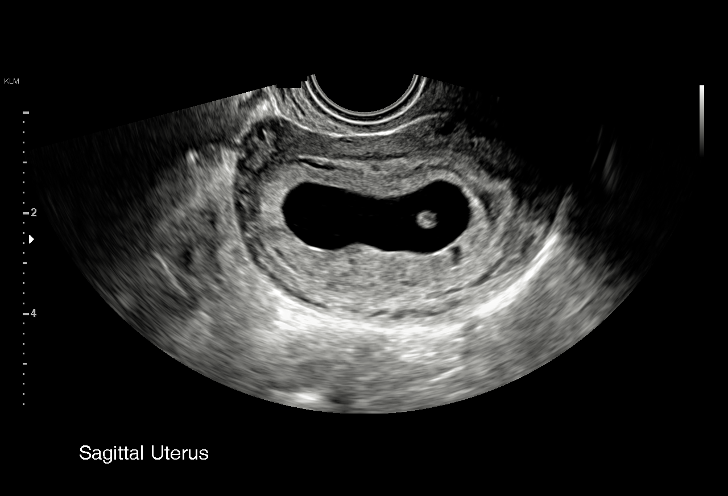
[im 15/34]
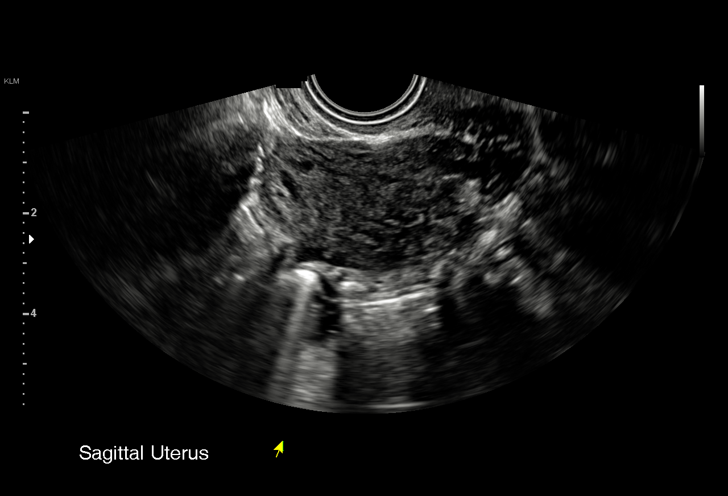
[im 18/34]
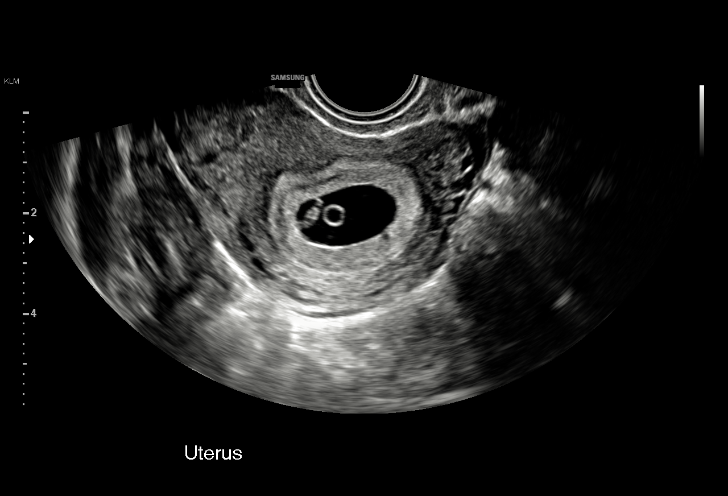
[im 19/34]
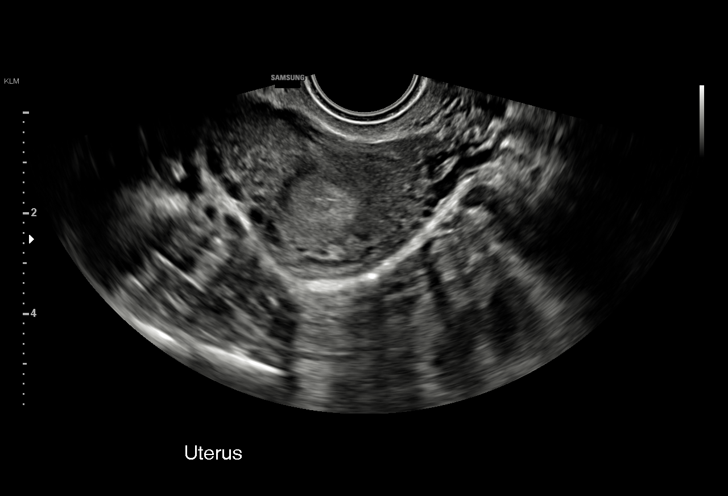
[im 21/34]
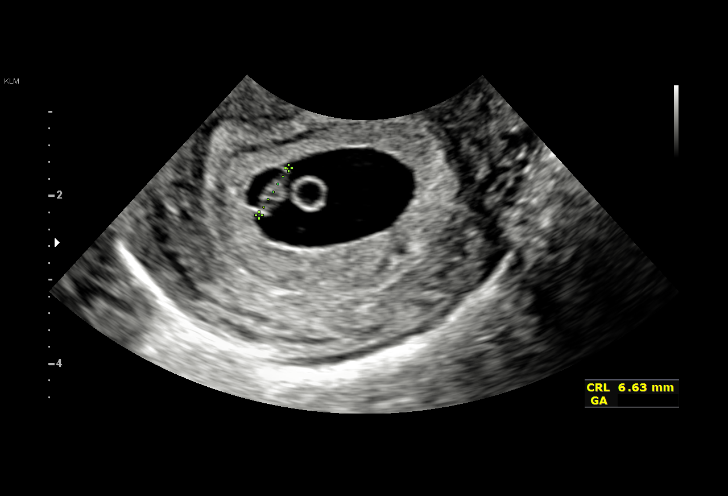
[im 24/34]
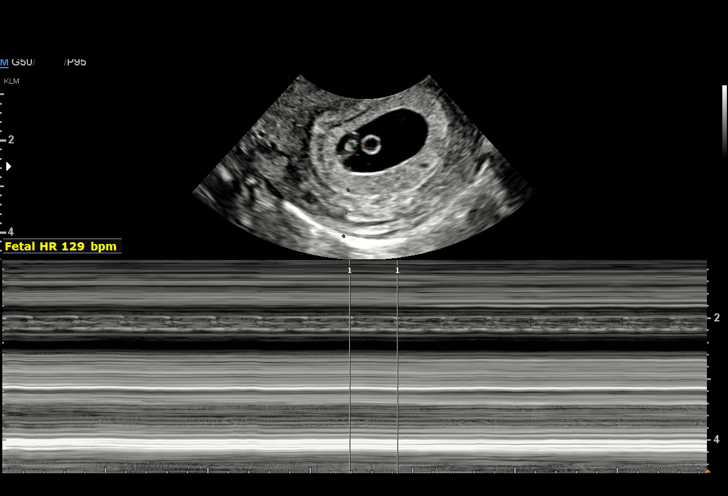
[im 26/34]
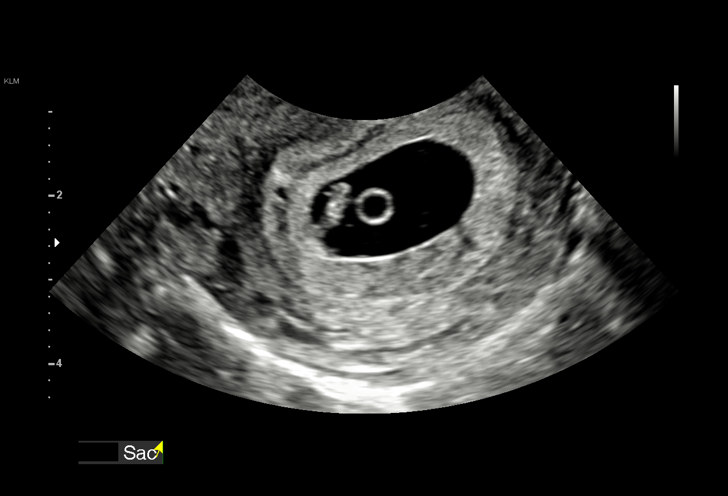
[im 29/34]
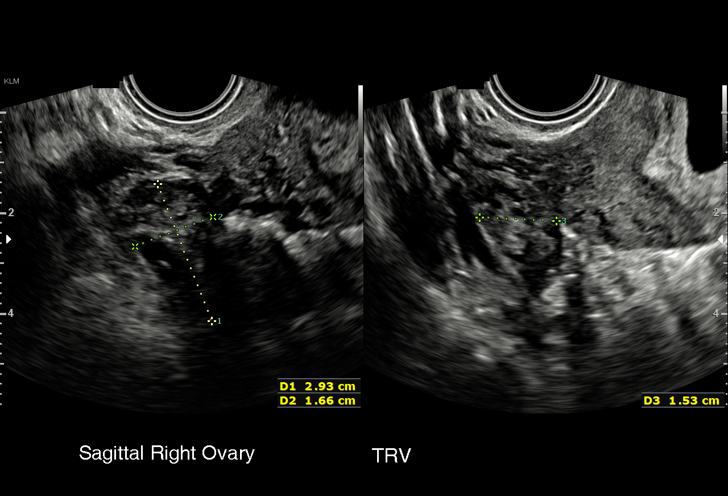
[im 31/34]
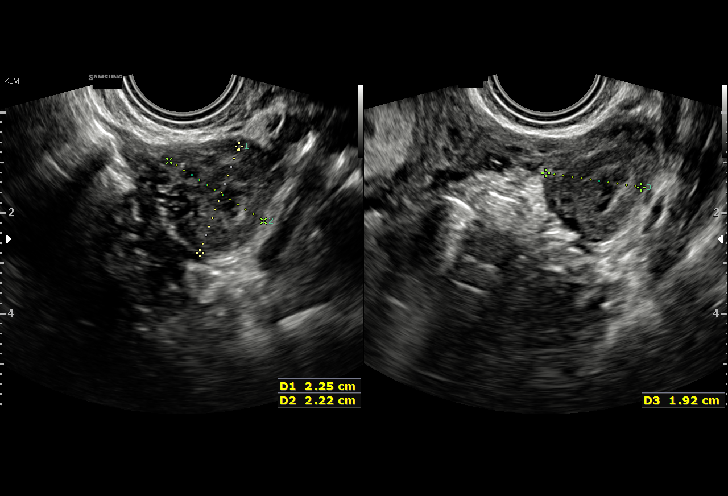
[im 34/34]
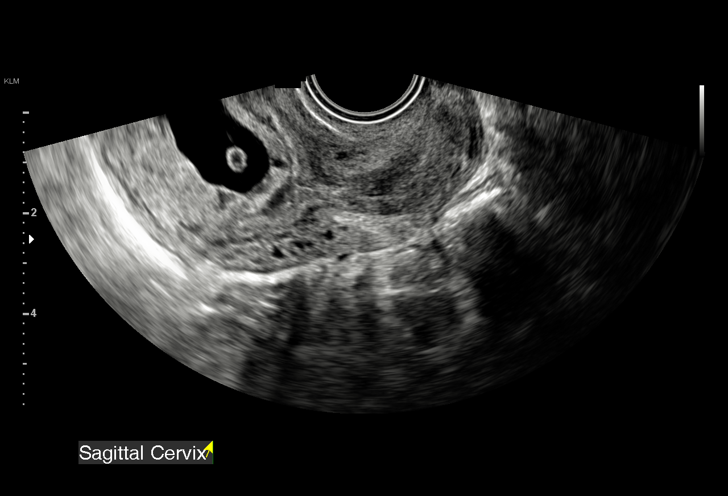

[15 of 28 positions shown; findings below may reference images not displayed]

FINDINGS: Intrauterine gestational sac: Single

Yolk sac:  Visualized.

Embryo:  Visualized.

Cardiac Activity: Visualized.

Heart Rate: 129 bpm

CRL: 6.7 mm   6 w   3 d                  US EDC: 09/23/2020

Subchorionic hemorrhage: There is a trace amount of subchorionic
hemorrhage.

Maternal uterus/adnexae: There is no maternal abnormality detected.
There is no significant free fluid.
IMPRESSION: Single live IUP at 6 weeks and 3 days as detailed above. There is a
trace amount of subchorionic hemorrhage.
